# Patient Record
Sex: Male | Born: 1980 | Race: Black or African American | Hispanic: No | Smoking: Never smoker
Health system: Southern US, Community
[De-identification: ages and names within clinical notes are randomized; demographics above are authoritative.]

## PROBLEM LIST (undated history)

## (undated) DIAGNOSIS — M1990 Unspecified osteoarthritis, unspecified site: Secondary | ICD-10-CM

---

## 2002-03-07 ENCOUNTER — Emergency Department (HOSPITAL_COMMUNITY): Admission: EM | Admit: 2002-03-07 | Discharge: 2002-03-07 | Payer: Self-pay | Admitting: *Deleted

## 2002-03-07 ENCOUNTER — Encounter: Payer: Self-pay | Admitting: Emergency Medicine

## 2007-04-25 ENCOUNTER — Emergency Department: Payer: Self-pay | Admitting: Emergency Medicine

## 2013-03-03 ENCOUNTER — Observation Stay: Payer: Self-pay | Admitting: Surgery

## 2013-03-03 LAB — COMPREHENSIVE METABOLIC PANEL
Albumin: 3.9 g/dL (ref 3.4–5.0)
Alkaline Phosphatase: 76 U/L (ref 50–136)
Anion Gap: 11 (ref 7–16)
BUN: 11 mg/dL (ref 7–18)
Bilirubin,Total: 0.2 mg/dL (ref 0.2–1.0)
Calcium, Total: 8.6 mg/dL (ref 8.5–10.1)
Chloride: 109 mmol/L — ABNORMAL HIGH (ref 98–107)
Co2: 23 mmol/L (ref 21–32)
Creatinine: 1.45 mg/dL — ABNORMAL HIGH (ref 0.60–1.30)
EGFR (African American): >60
EGFR (Non-African Amer.): >60
Glucose: 115 mg/dL — ABNORMAL HIGH (ref 65–99)
Osmolality: 285 (ref 275–301)
Potassium: 3.4 mmol/L — ABNORMAL LOW (ref 3.5–5.1)
SGOT(AST): 49 U/L — ABNORMAL HIGH (ref 15–37)
SGPT (ALT): 85 U/L — ABNORMAL HIGH (ref 12–78)
Sodium: 143 mmol/L (ref 136–145)
Total Protein: 8.4 g/dL — ABNORMAL HIGH (ref 6.4–8.2)

## 2013-03-03 LAB — URINALYSIS, COMPLETE
Bacteria: NONE SEEN
Bilirubin,UR: NEGATIVE
Glucose,UR: NEGATIVE mg/dL (ref 0–75)
Ketone: NEGATIVE
Leukocyte Esterase: NEGATIVE
Nitrite: NEGATIVE
Ph: 5 (ref 4.5–8.0)
Protein: NEGATIVE
RBC,UR: 1 /HPF (ref 0–5)
Specific Gravity: 1.01 (ref 1.003–1.030)
Squamous Epithelial: NONE SEEN
WBC UR: 1 /HPF (ref 0–5)

## 2013-03-03 LAB — CBC WITH DIFFERENTIAL/PLATELET
Basophil #: 0.1 10*3/uL (ref 0.0–0.1)
Basophil %: 0.4 %
Eosinophil #: 0.1 10*3/uL (ref 0.0–0.7)
Eosinophil %: 0.8 %
HCT: 43.1 % (ref 40.0–52.0)
HGB: 14.6 g/dL (ref 13.0–18.0)
Lymphocyte #: 5.3 10*3/uL — ABNORMAL HIGH (ref 1.0–3.6)
Lymphocyte %: 43.5 %
MCH: 28.7 pg (ref 26.0–34.0)
MCHC: 33.8 g/dL (ref 32.0–36.0)
MCV: 85 fL (ref 80–100)
Monocyte #: 1.1 x10 3/mm — ABNORMAL HIGH (ref 0.2–1.0)
Monocyte %: 8.8 %
Neutrophil #: 5.7 10*3/uL (ref 1.4–6.5)
Neutrophil %: 46.5 %
Platelet: 292 10*3/uL (ref 150–440)
RBC: 5.07 10*6/uL (ref 4.40–5.90)
RDW: 13.8 % (ref 11.5–14.5)
WBC: 12.2 10*3/uL — ABNORMAL HIGH (ref 3.8–10.6)

## 2013-03-03 LAB — ETHANOL
Ethanol %: 0.174 % — ABNORMAL HIGH (ref 0.000–0.080)
Ethanol: 174 mg/dL

## 2013-03-03 LAB — DRUG SCREEN, URINE

## 2013-03-03 LAB — PROTIME-INR
INR: 1
Prothrombin Time: 13.2 secs (ref 11.5–14.7)

## 2013-03-03 LAB — APTT: Activated PTT: 30.4 secs (ref 23.6–35.9)

## 2013-09-26 ENCOUNTER — Emergency Department: Payer: Self-pay | Admitting: Emergency Medicine
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
## 2017-04-26 ENCOUNTER — Other Ambulatory Visit: Payer: Self-pay | Admitting: Internal Medicine

## 2017-04-26 DIAGNOSIS — R748 Abnormal levels of other serum enzymes: Secondary | ICD-10-CM

## 2017-05-03 ENCOUNTER — Ambulatory Visit
Admission: RE | Admit: 2017-05-03 | Discharge: 2017-05-03 | Disposition: A | Discharge status: Home / Self Care | Payer: 59 | Source: Ambulatory Visit | Attending: Internal Medicine | Admitting: Internal Medicine

## 2017-05-03 DIAGNOSIS — R932 Abnormal findings on diagnostic imaging of liver and biliary tract: Secondary | ICD-10-CM | POA: Insufficient documentation

## 2017-05-03 DIAGNOSIS — R748 Abnormal levels of other serum enzymes: Secondary | ICD-10-CM | POA: Diagnosis not present

## 2017-12-01 ENCOUNTER — Other Ambulatory Visit: Payer: Self-pay

## 2017-12-01 ENCOUNTER — Emergency Department
Admission: EM | Admit: 2017-12-01 | Discharge: 2017-12-01 | Disposition: A | Discharge status: Home / Self Care | Payer: 59 | Attending: Emergency Medicine | Admitting: Emergency Medicine

## 2017-12-01 DIAGNOSIS — S39012A Strain of muscle, fascia and tendon of lower back, initial encounter: Secondary | ICD-10-CM | POA: Diagnosis not present

## 2017-12-01 DIAGNOSIS — Y9389 Activity, other specified: Secondary | ICD-10-CM | POA: Diagnosis not present

## 2017-12-01 DIAGNOSIS — Y998 Other external cause status: Secondary | ICD-10-CM | POA: Diagnosis not present

## 2017-12-01 DIAGNOSIS — S3992XA Unspecified injury of lower back, initial encounter: Secondary | ICD-10-CM | POA: Diagnosis present

## 2017-12-01 DIAGNOSIS — Y92009 Unspecified place in unspecified non-institutional (private) residence as the place of occurrence of the external cause: Secondary | ICD-10-CM | POA: Diagnosis not present

## 2017-12-01 DIAGNOSIS — X509XXA Other and unspecified overexertion or strenuous movements or postures, initial encounter: Secondary | ICD-10-CM | POA: Diagnosis not present

## 2017-12-01 MED ORDER — LIDOCAINE 5 % EX PTCH
1.0000 | MEDICATED_PATCH | CUTANEOUS | Status: DC
  Administered 2017-12-01: 1 via TRANSDERMAL
  Filled 2017-12-01: qty 1

## 2017-12-01 MED ORDER — LIDOCAINE 5 % EX PTCH: 1.0000 | MEDICATED_PATCH | Freq: Two times a day (BID) | CUTANEOUS | 0 refills | Status: AC

## 2017-12-01 MED ORDER — CYCLOBENZAPRINE HCL 5 MG PO TABS: ORAL_TABLET | ORAL | 0 refills | Status: DC

## 2017-12-01 MED ORDER — CYCLOBENZAPRINE HCL 10 MG PO TABS
5.0000 mg | ORAL_TABLET | Freq: Once | ORAL | Status: AC
  Administered 2017-12-01: 5 mg via ORAL
  Filled 2017-12-01: qty 1

## 2017-12-01 MED ORDER — KETOROLAC TROMETHAMINE 30 MG/ML IJ SOLN: 30.0000 mg | Freq: Once | INTRAMUSCULAR | Status: DC

## 2017-12-01 MED ORDER — OXYCODONE-ACETAMINOPHEN 5-325 MG PO TABS
1.0000 | ORAL_TABLET | Freq: Once | ORAL | Status: AC
  Administered 2017-12-01: 1 via ORAL
  Filled 2017-12-01: qty 1

## 2017-12-01 MED ORDER — KETOROLAC TROMETHAMINE 30 MG/ML IJ SOLN
30.0000 mg | Freq: Once | INTRAMUSCULAR | Status: AC
  Administered 2017-12-01: 30 mg via INTRAMUSCULAR
  Filled 2017-12-01: qty 1

## 2017-12-01 NOTE — ED Triage Notes (Signed)
Pt to ER via POV c/o lower back pain since bending over today at home. Pt alert and oriented X4, active, cooperative, pt in NAD. RR even and unlabored, color WNL.

## 2017-12-01 NOTE — ED Notes (Signed)
Pt reports that he is having back pain that started this am - he bent over this morning and felt a burning pain that has not been relieved since this am
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
## 2019-09-03 ENCOUNTER — Other Ambulatory Visit: Payer: Self-pay

## 2019-09-03 DIAGNOSIS — Z20822 Contact with and (suspected) exposure to covid-19: Secondary | ICD-10-CM

## 2019-09-04 LAB — NOVEL CORONAVIRUS, NAA: SARS-CoV-2, NAA: NOT DETECTED

## 2021-12-08 ENCOUNTER — Emergency Department: Payer: 59

## 2021-12-08 ENCOUNTER — Encounter: Payer: Self-pay | Admitting: Emergency Medicine

## 2021-12-08 ENCOUNTER — Other Ambulatory Visit: Payer: Self-pay

## 2021-12-08 ENCOUNTER — Emergency Department
Admission: EM | Admit: 2021-12-08 | Discharge: 2021-12-08 | Disposition: A | Discharge status: Home / Self Care | Payer: 59 | Attending: Emergency Medicine | Admitting: Emergency Medicine

## 2021-12-08 DIAGNOSIS — S86811A Strain of other muscle(s) and tendon(s) at lower leg level, right leg, initial encounter: Secondary | ICD-10-CM | POA: Diagnosis not present

## 2021-12-08 DIAGNOSIS — M25561 Pain in right knee: Secondary | ICD-10-CM | POA: Diagnosis not present

## 2021-12-08 DIAGNOSIS — Y92009 Unspecified place in unspecified non-institutional (private) residence as the place of occurrence of the external cause: Secondary | ICD-10-CM | POA: Diagnosis not present

## 2021-12-08 DIAGNOSIS — S8991XA Unspecified injury of right lower leg, initial encounter: Secondary | ICD-10-CM | POA: Diagnosis present

## 2021-12-08 DIAGNOSIS — W19XXXA Unspecified fall, initial encounter: Secondary | ICD-10-CM | POA: Diagnosis not present

## 2021-12-08 MED ORDER — OXYCODONE-ACETAMINOPHEN 7.5-325 MG PO TABS: 1.0000 | ORAL_TABLET | Freq: Four times a day (QID) | ORAL | 0 refills | Status: DC | PRN

## 2021-12-08 MED ORDER — OXYCODONE-ACETAMINOPHEN 7.5-325 MG PO TABS
1.0000 | ORAL_TABLET | Freq: Once | ORAL | Status: AC
  Administered 2021-12-08: 1 via ORAL
  Filled 2021-12-08: qty 1

## 2021-12-08 NOTE — TOC Initial Note (Signed)
Transition of Care (TOC) - Initial/Assessment Note  ? ? ?Patient Details  ?Name: Todd [name] ?MRN: [id number] ?Date of Birth: 25-Jul-1981 ? ?Transition of Care (TOC) CM/SW Contact:    ?Shelbie Hutching, RN ?Phone Number: ?12/08/2021, 2:38 PM ? ?Clinical Narrative:                 ? ?Patient came into the emergency room after falling at home and injuring his right knee.  Patient needs a bariatric walker to go home with to safely ambulate.  Walker ordered and referral accepted by [facility].  Danielle with [employer] will deliver to the patient's room- should be here before 3pm.   ?  ?Expected Discharge Plan: Home/Self Care ?Barriers to Discharge: Barriers Resolved ? ? ?Patient Goals and CMS Choice ?Patient states their goals for this hospitalization and ongoing recovery are:: Getting walker and returning home with wife ?  ?  ? ?Expected Discharge Plan and Services ?Expected Discharge Plan: Home/Self Care ?  ?  ?  ?Living arrangements for the past 2 months: [place] ?                ?DME Arranged: Walker rolling (Bariatric) ?DME Agency: [facility] ?Date DME Agency Contacted: 12/08/21 ?Time DME Agency Contacted: [id number] ?Representative spoke with at DME Agency: Stephannie Peters ?HH Arranged: NA ?[place] Agency: NA ?  ?  ?  ? ?Prior Living Arrangements/Services ?Living arrangements for the past 2 months: [place] ?Lives with:: Spouse ?Patient language and need for interpreter reviewed:: Yes ?Do you feel safe going back to the place where you live?: Yes      ?Need for Family Participation in Patient Care: Yes (Comment) ?Care giver support system in place?: Yes (comment) (wife) ?  ?Criminal Activity/Legal Involvement Pertinent to Current Situation/Hospitalization: No - Comment as needed ? ?Activities of Daily Living ?  ?  ? ?Permission Sought/Granted ?  ?  ?   ?   ?   ?   ? ?Emotional Assessment ?  ?  ?  ?Orientation: : Oriented to Self, Oriented to Place, Oriented to  Time, Oriented to Situation ?Alcohol / Substance Use:  Not Applicable ?Psych Involvement: No (comment) ? ?Admission diagnosis:  r knee pain ?There are no problems to display for this patient. ? ?PCP:  Lavera Guise, MD ?Pharmacy:   ?[facility] [address] ?[place] ?[place] [address] ?Phone: [phone] Fax: [phone] ? ? ? ? ?Social Determinants of Health (SDOH) Interventions ?  ? ?Readmission Risk Interventions ?No flowsheet data found. ? ? ?

## 2021-12-08 NOTE — ED Provider Notes (Signed)
? ?[facility] ?Provider Note ? ? ? Event Date/Time  ? First MD Initiated Contact with Patient 12/08/21 [phone]   ?  (approximate) ? ? ?History  ? ?Knee Pain and Fall ? ? ?HPI ? ?Todd [name] is a 41 y.o. male presents to the ED via EMS from home with complaint of mechanical fall and injury to his right knee.  Patient states that he slipped on a step in his home and his knee went back behind him.  Patient denies any head injury or loss of consciousness during this event.  Patient states he was initially able to ambulate with assistance but is having difficulty bearing weight on his right leg.  Patient rates his pain as 6 out of 10. ? ? ?Physical Exam  ? ?Triage Vital Signs: ?ED Triage Vitals  ?Enc Vitals Group  ?   BP 12/08/21 0809 (!) 192/100  ?   Pulse Rate 12/08/21 0809 72  ?   Resp 12/08/21 0809 18  ?   Temp 12/08/21 0809 (!) 97.4 ?F (36.3 ?C)  ?   Temp Source 12/08/21 0809 Oral  ?   SpO2 12/08/21 0809 96 %  ?   Weight 12/08/21 0801 (!) 360 lb 0.2 oz (163.3 kg)  ?   Height 12/08/21 0801 6\' 5"  (1.956 m)  ?   Head Circumference --   ?   Peak Flow --   ?   Pain Score 12/08/21 0800 6  ?   Pain Loc --   ?   Pain Edu? --   ?   Excl. in GC? --   ? ? ?Most recent vital signs: ?Vitals:  ? 12/08/21 0830 12/08/21 1153  ?BP: (!) 150/86 (!) 158/89  ?Pulse: 70 81  ?Resp: 20 16  ?Temp:    ?SpO2: 97% 97%  ? ? ? ?General: Awake, no distress.  ?CV:  Good peripheral perfusion.  Heart regular rate and rhythm. ?Resp:  Normal effort.  Lungs are clear bilaterally. ?Abd:  No distention.  Soft, nontender, bowel sounds normoactive x4 quadrants. ?Other:  Examination of the right lower extremity there is moderate soft tissue edema noted anterior knee area without open wound.  Marked tenderness to palpation.  Range of motion is deferred due to pain.  Pulses present distally.  Motor sensory function intact distally.  No ecchymosis or abrasions are seen at this time. ? ? ?ED Results / Procedures / Treatments   ? ?Labs ?(all labs ordered are listed, but only abnormal results are displayed) ?Labs Reviewed - No data to display ? ? ?RADIOLOGY ? ?X-ray right knee no fractures noted but small effusion present.  Radiology report mentions high patella orientation that is suspicious for a patella tendon injury.  MRI was then ordered and there is a full-thickness patella tendon tear. ? ? ?PROCEDURES: ? ?Critical Care performed:  ? ?Procedures ? ? ?MEDICATIONS ORDERED IN ED: ?Medications  ?oxyCODONE-acetaminophen (PERCOCET) 7.5-325 MG per tablet 1 tablet (1 tablet Oral Given 12/08/21 0836)  ?oxyCODONE-acetaminophen (PERCOCET) 7.5-325 MG per tablet 1 tablet (1 tablet Oral Given 12/08/21 1328)  ? ? ? ?IMPRESSION / MDM / ASSESSMENT AND PLAN / ED COURSE  ?I reviewed the triage vital signs and the nursing notes. ? ? ?Differential diagnosis includes, but is not limited to, fracture patella, fracture tibial plateau, right knee sprain, right knee contusion. ? ?41 year old male presents to the ED via EMS after a fall at home when he slipped off the step and landed with his knee behind his  body.  Patient denied any head injury or loss of consciousness during this event.  He states that he was unable to bear weight on his right leg.  At the time of initial exam there was moderate soft tissue edema present.  X-rays were suspicious for a patella tendon injury due to the location of his patella but no fractures were noted.  MRI confirmed that there was a full-thickness mid patella tendon injury.  Dr. Joice Lofts who is on-call for orthopedics was consulted and patient will be followed up in the office.  Instructions for a knee immobilizer, crutches/walker and pain medication.  It was determined that patient was over the weight limit for crutches that were in the emergency department.  Also the walker that was available to [place] was not able to be adjusted to his height.  A bariatric walker was obtained and patient was made aware that he needs to use this,  ice and elevation and wear knee immobilizer until he is seen by Dr. Joice Lofts.  Percocet 7.5 was sent to the pharmacy.  Also a note was written for patient to be out of work to the end of the month at which time Dr. Joice Lofts will be able to extend that work note to include any procedures that may need to be done. ? ? ? ? ? ?FINAL CLINICAL IMPRESSION(S) / ED DIAGNOSES  ? ?Final diagnoses:  ?Rupture of right patellar tendon, initial encounter  ?Fall in home, initial encounter  ? ? ? ?Rx / DC Orders  ? ?ED Discharge Orders   ? ? None  ? ?  ? ? ? ?Note:  This document was prepared using Dragon voice recognition software and may include unintentional dictation errors. ?  ?Tommi Rumps, PA-C ?12/08/21 1507 ? ?  ?Sharyn Creamer, MD ?12/08/21 1704 ? ?

## 2021-12-08 NOTE — ED Notes (Signed)
Pt tolerated walking well with bariatric walker.  ?

## 2021-12-08 NOTE — ED Notes (Signed)
Pt in MRI.

## 2021-12-08 NOTE — ED Triage Notes (Signed)
Pt comes into the ED via [facility] from home c/o mechanical fall and c/o right knee pain.  Pt has noticeable swelling noted to the knee.  Pt in NAD at this time with even and unlabored respirations.  Pt able to ambulate with assistance on the left leg, but was unable to bear weight on the right leg.   ?

## 2021-12-08 NOTE — Discharge Instructions (Addendum)
Call Dr. Binnie Rail office for an appointment time.  Ice and elevation to your right knee. ?Wear knee immobilizer until seen by Dr. Joice Lofts. ?Do not bear weight.  Use walker when up.  Pain medication was sent to your pharmacy.  Be aware that this medication could cause drowsiness and increase your risk for falling. ?

## 2021-12-08 NOTE — ED Notes (Addendum)
See triage note. Pt here with injury to his left knee. Knee is swollen and pt cannot bear weight on it. ?

## 2021-12-08 NOTE — ED Notes (Signed)
Patient transported to MRI 

## 2021-12-10 ENCOUNTER — Other Ambulatory Visit: Payer: Self-pay | Admitting: Orthopedic Surgery

## 2021-12-15 ENCOUNTER — Other Ambulatory Visit: Payer: Self-pay

## 2021-12-15 ENCOUNTER — Other Ambulatory Visit
Admission: RE | Admit: 2021-12-15 | Discharge: 2021-12-15 | Disposition: A | Discharge status: Home / Self Care | Payer: 59 | Source: Ambulatory Visit | Attending: Orthopedic Surgery | Admitting: Orthopedic Surgery

## 2021-12-15 HISTORY — DX: Unspecified osteoarthritis, unspecified site: M19.90

## 2021-12-15 NOTE — Patient Instructions (Addendum)
Your procedure is scheduled on: 12/16/21  ?Report to the Registration Desk on the 1st floor of the [facility]. ?To find out your arrival time, please call [phone] between 1PM - 3PM on: 12/15/21 ? ?REMEMBER: ?Instructions that are not followed completely may result in serious medical risk, up to and including death; or upon the discretion of your surgeon and anesthesiologist your surgery may need to be rescheduled. ? ?Do not eat food after midnight the night before surgery.  ?No gum chewing, lozengers or hard candies. ? ?You may however, drink CLEAR liquids up to 2 hours before you are scheduled to arrive for your surgery. Do not drink anything within 2 hours of your scheduled arrival time. ? ?Clear liquids include: ?- water  ?- apple juice without pulp ?- gatorade (not RED colors) ?- black coffee or tea (Do NOT add milk or creamers to the coffee or tea) ?Do NOT drink anything that is not on this list. ? ?TAKE THESE MEDICATIONS THE MORNING OF SURGERY WITH A SIP OF WATER:  ?- oxyCODONE-acetaminophen (PERCOCET) 7.5-325 MG tablet if needed. ? ?One week prior to surgery: ?Stop Anti-inflammatories (NSAIDS) such as Advil, Aleve, Ibuprofen, Motrin, Naproxen, Naprosyn and Aspirin based products such as Excedrin, Goodys Powder, BC Powder. ? ?Stop ANY OVER THE COUNTER supplements until after surgery. ? ?You may however, continue to take Tylenol if needed for pain up until the day of surgery. ? ?No Alcohol for 24 hours before or after surgery. ? ?No Smoking including e-cigarettes for 24 hours prior to surgery.  ?No chewable tobacco products for at least 6 hours prior to surgery.  ?No nicotine patches on the day of surgery. ? ?Do not use any "recreational" drugs for at least a week prior to your surgery.  ?Please be advised that the combination of cocaine and anesthesia may have negative outcomes, up to and including death. ?If you test positive for cocaine, your surgery will be cancelled. ? ?On the morning of surgery  brush your teeth with toothpaste and water, you may rinse your mouth with mouthwash if you wish. ?Do not swallow any toothpaste or mouthwash. ? ?Do not wear jewelry, make-up, hairpins, clips or nail polish. ? ?Do not wear lotions, powders, or perfumes.  ? ?Do not shave body from the neck down 48 hours prior to surgery just in case you cut yourself which could leave a site for infection.  ?Also, freshly shaved skin may become irritated if using the CHG soap. ? ?Contact lenses, hearing aids and dentures may not be worn into surgery. ? ?Do not bring valuables to the hospital. [facility] is not responsible for any missing/lost belongings or valuables.  ? ?Notify your doctor if there is any change in your medical condition (cold, fever, infection). ? ?Wear comfortable clothing (specific to your surgery type) to the hospital. ? ?After surgery, you can help prevent lung complications by doing breathing exercises.  ?Take deep breaths and cough every 1-2 hours. Your doctor may order a device called an Incentive Spirometer to help you take deep breaths. ?When coughing or sneezing, hold a pillow firmly against your incision with both hands. This is called ?splinting.? Doing this helps protect your incision. It also decreases belly discomfort. ? ?If you are being admitted to the hospital overnight, leave your suitcase in the car. ?After surgery it may be brought to your room. ? ?If you are being discharged the day of surgery, you will not be allowed to drive home. ?You will need a responsible  adult (18 years or older) to drive you home and stay with you that night.  ? ?If you are taking public transportation, you will need to have a responsible adult (18 years or older) with you. ?Please confirm with your physician that it is acceptable to use public transportation.  ? ?Please call the Pre-admissions Testing Dept. at [phone] if you have any questions about these instructions. ? ?Surgery Visitation Policy: ? ?Patients  undergoing a surgery or procedure may have two family members or support persons with them as long as the person is not COVID-19 positive or experiencing its symptoms.  ? ?Inpatient Visitation:   ? ?Visiting hours are 7 a.m. to 8 p.m. ?Up to four visitors are allowed at one time in a patient room, including children. The visitors may rotate out with other people during the day. One designated support person (adult) may remain overnight. ? ?All Areas: ?All visitors must pass COVID-19 screenings, use hand sanitizer when entering and exiting the patient?s room and wear a mask at all times, including in the patient?s room. ?Patients must also wear a mask when staff or their visitor are in the room. ?Masking is required regardless of vaccination status.  ?

## 2021-12-16 ENCOUNTER — Ambulatory Visit: Payer: 59 | Admitting: Urgent Care

## 2021-12-16 ENCOUNTER — Ambulatory Visit
Admission: RE | Admit: 2021-12-16 | Discharge: 2021-12-16 | Disposition: A | Discharge status: Home / Self Care | Payer: 59 | Attending: Orthopedic Surgery | Admitting: Orthopedic Surgery

## 2021-12-16 ENCOUNTER — Ambulatory Visit: Payer: 59

## 2021-12-16 ENCOUNTER — Other Ambulatory Visit: Payer: Self-pay

## 2021-12-16 ENCOUNTER — Encounter
Admission: RE | Disposition: A | Discharge status: Home / Self Care | Payer: Self-pay | Source: Home / Self Care | Attending: Orthopedic Surgery

## 2021-12-16 ENCOUNTER — Encounter: Payer: Self-pay | Admitting: Orthopedic Surgery

## 2021-12-16 DIAGNOSIS — X58XXXA Exposure to other specified factors, initial encounter: Secondary | ICD-10-CM | POA: Insufficient documentation

## 2021-12-16 DIAGNOSIS — W010XXA Fall on same level from slipping, tripping and stumbling without subsequent striking against object, initial encounter: Secondary | ICD-10-CM | POA: Insufficient documentation

## 2021-12-16 DIAGNOSIS — S76111A Strain of right quadriceps muscle, fascia and tendon, initial encounter: Secondary | ICD-10-CM | POA: Diagnosis not present

## 2021-12-16 HISTORY — PX: PATELLAR TENDON REPAIR: SHX737

## 2021-12-16 SURGERY — REPAIR, TENDON, PATELLAR
Anesthesia: General | Site: Knee | Laterality: Right

## 2021-12-16 MED ORDER — GLYCOPYRROLATE 0.2 MG/ML IJ SOLN
INTRAMUSCULAR | Status: DC | PRN
Start: 2021-12-16 — End: 2021-12-16
  Administered 2021-12-16: .2 mg via INTRAVENOUS

## 2021-12-16 MED ORDER — ONDANSETRON HCL 4 MG/2ML IJ SOLN: 4.0000 mg | Freq: Once | INTRAMUSCULAR | Status: DC | PRN

## 2021-12-16 MED ORDER — CHLORHEXIDINE GLUCONATE 0.12 % MT SOLN
OROMUCOSAL | Status: AC
  Administered 2021-12-16: 15 mL via OROMUCOSAL
  Filled 2021-12-16: qty 15

## 2021-12-16 MED ORDER — PROPOFOL 10 MG/ML IV BOLUS
INTRAVENOUS | Status: DC | PRN
  Administered 2021-12-16: 200 mg via INTRAVENOUS

## 2021-12-16 MED ORDER — SUGAMMADEX SODIUM 500 MG/5ML IV SOLN
INTRAVENOUS | Status: DC | PRN
  Administered 2021-12-16: 400 mg via INTRAVENOUS

## 2021-12-16 MED ORDER — CEFAZOLIN IN SODIUM CHLORIDE 3-0.9 GM/100ML-% IV SOLN
3.0000 g | INTRAVENOUS | Status: AC
  Administered 2021-12-16: 3 g via INTRAVENOUS
  Filled 2021-12-16: qty 100

## 2021-12-16 MED ORDER — SUGAMMADEX SODIUM 500 MG/5ML IV SOLN
INTRAVENOUS | Status: AC
  Filled 2021-12-16: qty 5

## 2021-12-16 MED ORDER — LABETALOL HCL 5 MG/ML IV SOLN
INTRAVENOUS | Status: DC | PRN
  Administered 2021-12-16 (×2): 10 mg via INTRAVENOUS

## 2021-12-16 MED ORDER — PROPOFOL 10 MG/ML IV BOLUS
INTRAVENOUS | Status: AC
  Filled 2021-12-16: qty 20

## 2021-12-16 MED ORDER — BUPIVACAINE-EPINEPHRINE (PF) 0.5% -1:200000 IJ SOLN
INTRAMUSCULAR | Status: AC
Start: 2021-12-16 — End: ?
  Filled 2021-12-16: qty 30

## 2021-12-16 MED ORDER — NEOMYCIN-POLYMYXIN B GU 40-200000 IR SOLN
Status: DC | PRN
  Administered 2021-12-16: 4 mL

## 2021-12-16 MED ORDER — ROPIVACAINE HCL 5 MG/ML IJ SOLN
INTRAMUSCULAR | Status: DC | PRN
  Administered 2021-12-16: 20 mL via PERINEURAL

## 2021-12-16 MED ORDER — CHLORHEXIDINE GLUCONATE 0.12 % MT SOLN: 15.0000 mL | Freq: Once | OROMUCOSAL | Status: AC

## 2021-12-16 MED ORDER — OXYCODONE HCL 5 MG PO TABS
5.0000 mg | ORAL_TABLET | Freq: Once | ORAL | Status: AC | PRN
  Administered 2021-12-16: 5 mg via ORAL

## 2021-12-16 MED ORDER — ROPIVACAINE HCL 5 MG/ML IJ SOLN
INTRAMUSCULAR | Status: AC
  Filled 2021-12-16: qty 30

## 2021-12-16 MED ORDER — ASPIRIN EC 325 MG PO TBEC: 325.0000 mg | DELAYED_RELEASE_TABLET | Freq: Every day | ORAL | 0 refills | Status: AC

## 2021-12-16 MED ORDER — SODIUM CHLORIDE 0.9 % IR SOLN
Status: DC | PRN
  Administered 2021-12-16: 1000 mL

## 2021-12-16 MED ORDER — FENTANYL CITRATE (PF) 100 MCG/2ML IJ SOLN
INTRAMUSCULAR | Status: AC
Start: 2021-12-16 — End: ?
  Filled 2021-12-16: qty 2

## 2021-12-16 MED ORDER — DEXMEDETOMIDINE (PRECEDEX) IN NS 20 MCG/5ML (4 MCG/ML) IV SYRINGE
PREFILLED_SYRINGE | INTRAVENOUS | Status: DC | PRN
  Administered 2021-12-16 (×2): 8 ug via INTRAVENOUS
  Administered 2021-12-16: 12 ug via INTRAVENOUS
  Administered 2021-12-16 (×3): 8 ug via INTRAVENOUS

## 2021-12-16 MED ORDER — ORAL CARE MOUTH RINSE: 15.0000 mL | Freq: Once | OROMUCOSAL | Status: AC

## 2021-12-16 MED ORDER — ONDANSETRON HCL 4 MG/2ML IJ SOLN
INTRAMUSCULAR | Status: DC | PRN
  Administered 2021-12-16: 4 mg via INTRAVENOUS

## 2021-12-16 MED ORDER — FENTANYL CITRATE (PF) 100 MCG/2ML IJ SOLN
INTRAMUSCULAR | Status: AC
  Administered 2021-12-16: 25 ug via INTRAVENOUS
  Filled 2021-12-16: qty 2

## 2021-12-16 MED ORDER — DEXAMETHASONE SODIUM PHOSPHATE 10 MG/ML IJ SOLN
INTRAMUSCULAR | Status: DC | PRN
  Administered 2021-12-16: 5 mg via INTRAVENOUS

## 2021-12-16 MED ORDER — ACETAMINOPHEN 10 MG/ML IV SOLN: 1000.0000 mg | Freq: Once | INTRAVENOUS | Status: DC | PRN

## 2021-12-16 MED ORDER — MIDAZOLAM HCL 2 MG/2ML IJ SOLN: 4.0000 mg | Freq: Once | INTRAMUSCULAR | Status: AC

## 2021-12-16 MED ORDER — PHENYLEPHRINE HCL (PRESSORS) 10 MG/ML IV SOLN
INTRAVENOUS | Status: DC | PRN
  Administered 2021-12-16: 160 ug via INTRAVENOUS

## 2021-12-16 MED ORDER — ONDANSETRON HCL 4 MG/2ML IJ SOLN
INTRAMUSCULAR | Status: AC
  Filled 2021-12-16: qty 2

## 2021-12-16 MED ORDER — DEXMEDETOMIDINE HCL IN NACL 200 MCG/50ML IV SOLN
INTRAVENOUS | Status: AC
  Filled 2021-12-16: qty 50

## 2021-12-16 MED ORDER — LABETALOL HCL 5 MG/ML IV SOLN
INTRAVENOUS | Status: AC
  Filled 2021-12-16: qty 4

## 2021-12-16 MED ORDER — DEXAMETHASONE SODIUM PHOSPHATE 4 MG/ML IJ SOLN
INTRAMUSCULAR | Status: DC | PRN
  Administered 2021-12-16: 4 mg via PERINEURAL

## 2021-12-16 MED ORDER — OXYCODONE HCL 5 MG/5ML PO SOLN: 5.0000 mg | Freq: Once | ORAL | Status: AC | PRN

## 2021-12-16 MED ORDER — ACETAMINOPHEN 10 MG/ML IV SOLN
INTRAVENOUS | Status: AC
  Filled 2021-12-16: qty 100

## 2021-12-16 MED ORDER — BUPIVACAINE LIPOSOME 1.3 % IJ SUSP
INTRAMUSCULAR | Status: DC | PRN
  Administered 2021-12-16: 20 mL

## 2021-12-16 MED ORDER — ROCURONIUM BROMIDE 100 MG/10ML IV SOLN
INTRAVENOUS | Status: DC | PRN
  Administered 2021-12-16: 40 mg via INTRAVENOUS
  Administered 2021-12-16 (×2): 20 mg via INTRAVENOUS

## 2021-12-16 MED ORDER — LIDOCAINE-EPINEPHRINE 1 %-1:100000 IJ SOLN
INTRAMUSCULAR | Status: AC
  Filled 2021-12-16: qty 1

## 2021-12-16 MED ORDER — DEXAMETHASONE SODIUM PHOSPHATE 10 MG/ML IJ SOLN
INTRAMUSCULAR | Status: AC
  Filled 2021-12-16: qty 1

## 2021-12-16 MED ORDER — ONDANSETRON 4 MG PO TBDP: 4.0000 mg | ORAL_TABLET | Freq: Three times a day (TID) | ORAL | 0 refills | Status: DC | PRN

## 2021-12-16 MED ORDER — OXYCODONE HCL 5 MG PO TABS: 5.0000 mg | ORAL_TABLET | ORAL | 0 refills | Status: AC | PRN

## 2021-12-16 MED ORDER — FENTANYL CITRATE (PF) 100 MCG/2ML IJ SOLN
INTRAMUSCULAR | Status: DC | PRN
  Administered 2021-12-16 (×4): 50 ug via INTRAVENOUS

## 2021-12-16 MED ORDER — FENTANYL CITRATE (PF) 100 MCG/2ML IJ SOLN
INTRAMUSCULAR | Status: AC
  Filled 2021-12-16: qty 2

## 2021-12-16 MED ORDER — NEOMYCIN-POLYMYXIN B GU 40-200000 IR SOLN
Status: AC
Start: 2021-12-16 — End: ?
  Filled 2021-12-16: qty 2

## 2021-12-16 MED ORDER — LACTATED RINGERS IV SOLN: INTRAVENOUS | Status: DC

## 2021-12-16 MED ORDER — OXYCODONE HCL 5 MG PO TABS
ORAL_TABLET | ORAL | Status: AC
  Filled 2021-12-16: qty 1

## 2021-12-16 MED ORDER — ACETAMINOPHEN 10 MG/ML IV SOLN
INTRAVENOUS | Status: DC | PRN
  Administered 2021-12-16: 1000 mg via INTRAVENOUS

## 2021-12-16 MED ORDER — MIDAZOLAM HCL 2 MG/2ML IJ SOLN
INTRAMUSCULAR | Status: AC
  Administered 2021-12-16: 4 mg via INTRAVENOUS
  Filled 2021-12-16: qty 4

## 2021-12-16 MED ORDER — BUPIVACAINE HCL (PF) 0.5 % IJ SOLN
INTRAMUSCULAR | Status: AC
  Filled 2021-12-16: qty 30

## 2021-12-16 MED ORDER — SEVOFLURANE IN SOLN
RESPIRATORY_TRACT | Status: AC
  Filled 2021-12-16: qty 250

## 2021-12-16 MED ORDER — LIDOCAINE HCL (PF) 2 % IJ SOLN
INTRAMUSCULAR | Status: AC
  Filled 2021-12-16: qty 5

## 2021-12-16 MED ORDER — LIDOCAINE HCL (CARDIAC) PF 100 MG/5ML IV SOSY
PREFILLED_SYRINGE | INTRAVENOUS | Status: DC | PRN
  Administered 2021-12-16: 100 mg via INTRAVENOUS

## 2021-12-16 MED ORDER — LIDOCAINE-EPINEPHRINE 1 %-1:100000 IJ SOLN
INTRAMUSCULAR | Status: AC
Start: 2021-12-16 — End: ?
  Filled 2021-12-16: qty 1

## 2021-12-16 MED ORDER — BUPIVACAINE LIPOSOME 1.3 % IJ SUSP
INTRAMUSCULAR | Status: AC
  Filled 2021-12-16: qty 20

## 2021-12-16 MED ORDER — ACETAMINOPHEN 500 MG PO TABS: 1000.0000 mg | ORAL_TABLET | Freq: Three times a day (TID) | ORAL | 2 refills | Status: AC

## 2021-12-16 MED ORDER — SUCCINYLCHOLINE CHLORIDE 200 MG/10ML IV SOSY
PREFILLED_SYRINGE | INTRAVENOUS | Status: DC | PRN
  Administered 2021-12-16: 140 mg via INTRAVENOUS

## 2021-12-16 MED ORDER — LIDOCAINE HCL (PF) 1 % IJ SOLN
INTRAMUSCULAR | Status: AC
  Filled 2021-12-16: qty 30

## 2021-12-16 MED ORDER — FENTANYL CITRATE (PF) 100 MCG/2ML IJ SOLN
25.0000 ug | INTRAMUSCULAR | Status: DC | PRN
  Administered 2021-12-16: 50 ug via INTRAVENOUS
  Administered 2021-12-16: 25 ug via INTRAVENOUS

## 2021-12-16 MED ORDER — SODIUM CHLORIDE (PF) 0.9 % IJ SOLN
INTRAMUSCULAR | Status: AC
  Filled 2021-12-16: qty 10

## 2021-12-16 MED ORDER — SUCCINYLCHOLINE CHLORIDE 200 MG/10ML IV SOSY
PREFILLED_SYRINGE | INTRAVENOUS | Status: AC
  Filled 2021-12-16: qty 10

## 2021-12-16 SURGICAL SUPPLY — 94 items
"PENCIL ELECTRO HAND CTR " (MISCELLANEOUS) ×1 IMPLANT
ADAPTER IRRIG TUBE 2 SPIKE SOL (ADAPTER) ×4 IMPLANT
ADPR TBG 2 SPK PMP STRL ASCP (ADAPTER)
ANCH SUT 1.4 SUT TPE BLK/WHT (SUTURE) ×2
ANCH SUT SWLK 19.1X4.75 (Anchor) ×2 IMPLANT
ANCHOR SUT BIO SW 4.75X19.1 (Anchor) ×2 IMPLANT
BANDAGE ACE 4X5 VEL STRL LF (GAUZE/BANDAGES/DRESSINGS) ×3 IMPLANT
BANDAGE ACE 6X5 VEL STRL LF (GAUZE/BANDAGES/DRESSINGS) ×6 IMPLANT
BASIN GRAD PLASTIC 32OZ STRL (MISCELLANEOUS) ×2 IMPLANT
BLADE SURG 10 STRL SS (BLADE) ×2 IMPLANT
BLADE SURG 15 STRL LF DISP TIS (BLADE) IMPLANT
BLADE SURG 15 STRL SS (BLADE) ×4
BLADE SURG SZ10 CARB STEEL (BLADE) ×6 IMPLANT
BLADE SURG SZ11 CARB STEEL (BLADE) ×2 IMPLANT
BNDG ESMARK 6X12 TAN STRL LF (GAUZE/BANDAGES/DRESSINGS) ×3 IMPLANT
BRACE KNEE POST OP SHORT (BRACE) ×2 IMPLANT
BUR 4X45 EGG (BURR) ×2 IMPLANT
BUR 4X55 1 (BURR) ×2 IMPLANT
BUR RADIUS 3.5 (BURR) ×2 IMPLANT
BUR RADIUS 4.0X18.5 (BURR) ×2 IMPLANT
CAST PADDING 6X4YD ST 30248 (SOFTGOODS) ×1
CHLORAPREP W/TINT 26ML (MISCELLANEOUS) ×6 IMPLANT
COOLER POLAR GLACIER W/PUMP (MISCELLANEOUS) ×3 IMPLANT
COVER MAYO STAND STRL (DRAPES) ×3 IMPLANT
DRAPE C-ARM XRAY 36X54 (DRAPES) ×2 IMPLANT
DRAPE C-ARMOR (DRAPES) ×2 IMPLANT
DRAPE IMP U-DRAPE 54X76 (DRAPES) ×3 IMPLANT
DRAPE SHEET LG 3/4 BI-LAMINATE (DRAPES) ×2 IMPLANT
DRAPE SURG 17X11 SM STRL (DRAPES) ×6 IMPLANT
DRAPE TABLE BACK 80X90 (DRAPES) ×3 IMPLANT
DRSG OPSITE POSTOP 3X4 (GAUZE/BANDAGES/DRESSINGS) ×2 IMPLANT
DRSG OPSITE POSTOP 4X8 (GAUZE/BANDAGES/DRESSINGS) ×1 IMPLANT
ELECT REM PT RETURN 9FT ADLT (ELECTROSURGICAL) ×2
ELECTRODE REM PT RTRN 9FT ADLT (ELECTROSURGICAL) ×2 IMPLANT
GAUZE SPONGE 4X4 12PLY STRL (GAUZE/BANDAGES/DRESSINGS) ×2 IMPLANT
GAUZE XEROFORM 1X8 LF (GAUZE/BANDAGES/DRESSINGS) ×1 IMPLANT
GLOVE BIOGEL PI IND STRL 8 (GLOVE) ×4 IMPLANT
GLOVE BIOGEL PI INDICATOR 8 (GLOVE) ×1
GLOVE SURG ORTHO 8.0 STRL STRW (GLOVE) ×9 IMPLANT
GOWN STRL REUS W/ TWL LRG LVL3 (GOWN DISPOSABLE) ×4 IMPLANT
GOWN STRL REUS W/ TWL XL LVL3 (GOWN DISPOSABLE) ×2 IMPLANT
GOWN STRL REUS W/TWL LRG LVL3 (GOWN DISPOSABLE) ×4
GOWN STRL REUS W/TWL XL LVL3 (GOWN DISPOSABLE) ×2
GRADUATE 1200CC STRL 31836 (MISCELLANEOUS) ×2 IMPLANT
HANDLE YANKAUER SUCT BULB TIP (MISCELLANEOUS) ×3 IMPLANT
KIT TURNOVER KIT A (KITS) ×3 IMPLANT
LABEL OR SOLS (LABEL) ×3 IMPLANT
MANIFOLD NEPTUNE II (INSTRUMENTS) ×3 IMPLANT
MAT BLUE FLOOR 46X72 FLO (MISCELLANEOUS) ×2 IMPLANT
NDL FILTER BLUNT 18X1 1/2 (NEEDLE) ×2 IMPLANT
NDL MAYO 6 CRC TAPER PT (NEEDLE) IMPLANT
NEEDLE FILTER BLUNT 18X 1/2SAF (NEEDLE) ×1
NEEDLE FILTER BLUNT 18X1 1/2 (NEEDLE) ×1 IMPLANT
NEEDLE HYPO 22GX1.5 SAFETY (NEEDLE) ×2 IMPLANT
NEEDLE MAYO 6 CRC TAPER PT (NEEDLE) ×2 IMPLANT
NS IRRIG 1000ML POUR BTL (IV SOLUTION) ×3 IMPLANT
PACK ARTHROSCOPY KNEE (MISCELLANEOUS) ×3 IMPLANT
PAD ABD DERMACEA PRESS 5X9 (GAUZE/BANDAGES/DRESSINGS) ×2 IMPLANT
PAD CAST CTTN 4X4 STRL (SOFTGOODS) ×2 IMPLANT
PAD WRAPON POLAR KNEE (MISCELLANEOUS) ×2 IMPLANT
PADDING CAST 6X4YD NS (MISCELLANEOUS) ×1
PADDING CAST COTTON 4X4 STRL (SOFTGOODS) ×2
PADDING CAST COTTON 6X4 NS (MISCELLANEOUS) ×2 IMPLANT
PADDING CAST COTTON 6X4 ST (SOFTGOODS) IMPLANT
PENCIL ELECTRO HAND CTR (MISCELLANEOUS) ×3 IMPLANT
RETRIEVER SUT HEWSON (MISCELLANEOUS) ×2 IMPLANT
SET TUBE SUCT SHAVER OUTFL 24K (TUBING) ×2 IMPLANT
SET TUBE TIP INTRA-ARTICULAR (MISCELLANEOUS) ×2 IMPLANT
SPONGE LAP 18X18 5 PK (GAUZE/BANDAGES/DRESSINGS) ×3 IMPLANT
STAPLER SKIN PROX 35W (STAPLE) ×3 IMPLANT
STOCKINETTE BIAS CUT 6 980064 (GAUZE/BANDAGES/DRESSINGS) ×1 IMPLANT
SUCTION FRAZIER HANDLE 10FR (MISCELLANEOUS)
SUCTION TUBE FRAZIER 10FR DISP (MISCELLANEOUS) ×2 IMPLANT
SUT ETHILON 3-0 FS-10 30 BLK (SUTURE) ×2
SUT VIC AB 0 CT1 36 (SUTURE) ×3 IMPLANT
SUT VIC AB 1 CT1 36 (SUTURE) ×2 IMPLANT
SUT VIC AB 2-0 CT1 27 (SUTURE) ×4
SUT VIC AB 2-0 CT1 TAPERPNT 27 (SUTURE) ×4 IMPLANT
SUT VIC AB 2-0 CT2 27 (SUTURE) ×3 IMPLANT
SUT XBRAID 1.4 BLK/WHT (SUTURE) ×2 IMPLANT
SUT XBRAID 1.4 WHITE/BLUE (SUTURE) ×2 IMPLANT
SUTURE EHLN 3-0 FS-10 30 BLK (SUTURE) ×2 IMPLANT
SUTURE TAPE FIBERLINK 1.3 LOOP (SUTURE) IMPLANT
SUTURETAPE FIBERLINK 1.3 LOOP (SUTURE) ×2
SYR 10ML LL (SYRINGE) ×3 IMPLANT
SYR 30ML LL (SYRINGE) ×3 IMPLANT
SYR BULB IRRIG 60ML STRL (SYRINGE) ×3 IMPLANT
SYS INTERNAL BRACE KNEE (Miscellaneous) ×2 IMPLANT
SYSTEM INTERNAL BRACE KNEE (Miscellaneous) IMPLANT
TOWEL OR 17X26 4PK STRL BLUE (TOWEL DISPOSABLE) ×6 IMPLANT
TUBING ARTHRO INFLOW-ONLY STRL (TUBING) ×2 IMPLANT
WAND HAND CNTRL MULTIVAC 50 (MISCELLANEOUS) ×2 IMPLANT
WAND WEREWOLF FLOW 90D (MISCELLANEOUS) ×2 IMPLANT
WRAPON POLAR PAD KNEE (MISCELLANEOUS) ×2

## 2021-12-16 NOTE — Transfer of Care (Signed)
Immediate Anesthesia Transfer of Care Note ? ?Patient: Todd [name] ? ?Procedure(s) Performed: Right patellar tendon repair (Right: Knee) ? ?Patient Location: PACU ? ?Anesthesia Type:General ? ?Level of Consciousness: awake and drowsy ? ?Airway & Oxygen Therapy: Patient Spontanous Breathing and Patient connected to face mask oxygen ? ?Post-op Assessment: Report given to RN and Post -op Vital signs reviewed and stable ? ?Post vital signs: Reviewed and stable ? ?Last Vitals:  ?Vitals Value Taken Time  ?BP 158/93 12/16/21 1446  ?Temp 35.9   ?Pulse 82 12/16/21 1449  ?Resp 29 12/16/21 1449  ?SpO2 100 % 12/16/21 1449  ?Vitals shown include unvalidated device data. ? ?Last Pain:  ?Vitals:  ? 12/16/21 1150  ?TempSrc:   ?PainSc: 0-No pain  ?   ? ?  ? ?Complications: No notable events documented. ?

## 2021-12-16 NOTE — Op Note (Signed)
DATE OF SURGERY: 12/16/2021 ? ?PRE-OP DIAGNOSIS: Right Patellar Tendon Rupture ?  ?POST-OP DIAGNOSIS: Right Patellar Tendon Rupture ? ?PROCEDURES: Right Patellar Tendon Repair ? ?SURGEON: Cato Mulligan, MD ? ?ASSISTANT(S): none ? ?ANESTHESIA: regional + Gen ? ?TOTAL IV FLUIDS: see anesthesia record ? ?ESTIMATED BLOOD LOSS: 5cc ? ?DRAINS:  none ? ?SPECIMENS: None. ? ?IMPLANTS: Arthrex 4.76m SwiveLock anchors x 4 ? ?COMPLICATIONS: None apparent. ? ?INDICATIONS: ?Todd [name] a 41y.o. male with a patellar tendon rupture who sustained the injury after slipping on a step at home.  He was unable to bear weight afterwards.  Examination was notable for an obvious gap in the patellar tendon at the level of the patella and displacement of the patella proximally.  Diagnosis was confirmed with MRI.  The patient was unable to perform a straight leg raise. After discussion of risks, benefits, and alternatives to surgery, the patient elected to proceed with patellar tendon repair. ? ?DETAILS OF PROCEDURE: ?Todd CPelzerwas met in the preoperative holding area and informed consent was verified.  The patient was brought to the operating room and placed supine on the table. Anesthesia was administered. Leg was prescrubbed with Hibiclens and alcohol, prepped with ChloraPrep and draped in the usual sterile fashion. The patient was given preoperative IV antibiotics within 30 minutes of the start of the case, and a surgical time-out occurred. A well-padded tourniquet was placed.  ? ?The leg was elevated, exsanguinated with an Esmarch bandage and tourniquet inflated. A midline incision was created on the knee from the inferior pole of the patella to the tibial tubercle. The retinacular layer was developed, medial and lateral, in line with the skin incision. At that point, an obvious tear of the patellar tendon was notable.  It was a mid substance tear.  Medial and lateral retinacular tears were also identified. Edges of the  patellar tendon were debrided to healthy tendon. ? ?2 sets of suture tape were passed in a locking Krak?w fashion in the proximal patellar tendon, 1 medially and 1 laterally such that there were 4 strands in the proximal segment.  This was repeated in the distal patellar tendon.  Next, 2 SwiveLock anchors were placed in the inferior patella, 1 medially and 1 laterally.  These were loaded with FiberTape to serve as an internal brace.  These FiberTapes were brought from posterior to anterior using a fiber loop.  The wound was then thoroughly irrigated. Next, attention was pulled on the proximal suture tapes to bring the patella out of patella alta.  Proximal sutures were tied to their corresponding sutures distally allowing for an excellent repair of the patellar tendon itself.  This was performed in full extension next, the knee was brought into approximately 30 degrees knee flexion.  There was no gapping of the patellar tendon.  One strand of each of the medial and lateral fiber tapes was loaded onto another SwiveLock anchor. This was appropriately drilled and placed just medial to the tibial tubercle.  This process was repeated for a lateral SwiveLock anchor.  This served as the internal brace construct and served as a check rein to greater than 30 degrees knee flexion. ? ?Next, the medial and lateral retinacular layers were closed with #1 Vicryl suture in a figure of 8 fashion. The wound was irrigated again.  The patient could easily reach 30 degrees of flexion before there was a increase in tension.  There was no gap formation about the patellar tendon. 2-0 Vicryl was used to close  the subdermal layer tissue. Staples were used to close skin. Xeroform gauze and sterile dressing was applied.  Tourniquet was let down. PolarCare and hinged knee brace locked at 0 degrees were applied. Instrument, sponge, and needle counts were correct prior to wound closure and at the conclusion of the case. The patient was then  awakened from anesthesia without complication. ? ?Of note, this case had significantly added complexity compared to standard patellar tendon repair.  Given the patient's weight of over 160 kg, decision was made to further reinforce patellar tendon repair using internal brace mechanism with fiber tape.  This was performed to reduce the chance of patellar tendon rerupture.  This construct required use of an additional 4 implants, requiring additional exposure and instrumentation.  This process added approximately 45 minutes to the standard surgical time for patellar tendon repair. ? ?POST-OPERATIVE PLAN: ?- ASA 359m/day x 4 weeks for DVT ppx ?- WBAT on operative lower extremity with brace locked in extension x 6 weeks ?- PT/OT to start on POD #3-5 ?- Follow-up with me in approximately 2 weeks ? ?

## 2021-12-16 NOTE — Anesthesia Preprocedure Evaluation (Signed)
Anesthesia Evaluation  ?Patient identified by MRN, date of birth, ID band ?Patient awake ? ? ? ?Reviewed: ?Allergy & Precautions, NPO status , Patient's Chart, lab work & pertinent test results ? ?History of Anesthesia Complications ?Negative for: history of anesthetic complications ? ?Airway ?Mallampati: II ? ?TM Distance: >3 FB ?Neck ROM: Full ? ? ? Dental ?no notable dental hx. ?(+) Teeth Intact ?  ?Pulmonary ?neg pulmonary ROS, neg sleep apnea, neg COPD, Patient abstained from smoking.Not current smoker,  ?  ?Pulmonary exam normal ?breath sounds clear to auscultation ? ? ? ? ? ? Cardiovascular ?Exercise Tolerance: Good ?METS(-) hypertension(-) CAD and (-) Past MI negative cardio ROS ? ?(-) dysrhythmias  ?Rhythm:Regular Rate:Normal ?- Systolic murmurs ? ?  ?Neuro/Psych ?negative neurological ROS ? negative psych ROS  ? GI/Hepatic ?neg GERD  ,(+)  ?  ? (-) substance abuse ? ,   ?Endo/Other  ?neg diabetesMorbid obesity ? Renal/GU ?negative Renal ROS  ? ?  ?Musculoskeletal ? ?(+) Arthritis ,  ? Abdominal ?(+) + obese,   ?Peds ? Hematology ?  ?Anesthesia Other Findings ?Past Medical History: ?No date: Arthritis ? Reproductive/Obstetrics ? ?  ? ? ? ? ? ? ? ? ? ? ? ? ? ?  ?  ? ? ? ? ? ? ? ? ?Anesthesia Physical ?Anesthesia Plan ? ?ASA: 3 ? ?Anesthesia Plan: General  ? ?Post-op Pain Management: Ofirmev IV (intra-op)*, Toradol IV (intra-op)* and Regional block*  ? ?Induction: Intravenous ? ?PONV Risk Score and Plan: 3 and Ondansetron, Dexamethasone and Midazolam ? ?Airway Management Planned: Oral ETT and Video Laryngoscope Planned ? ?Additional Equipment: None ? ?Intra-op Plan:  ? ?Post-operative Plan: Extubation in OR ? ?Informed Consent: I have reviewed the patients History and Physical, chart, labs and discussed the procedure including the risks, benefits and alternatives for the proposed anesthesia with the patient or authorized representative who has indicated his/her understanding  and acceptance.  ? ? ? ?Dental advisory given ? ?Plan Discussed with: CRNA and Surgeon ? ?Anesthesia Plan Comments: (Discussed risks of anesthesia with patient, including PONV, sore throat, lip/dental/eye damage. Rare risks discussed as well, such as cardiorespiratory and neurological sequelae, and allergic reactions. Discussed the role of CRNA in patient's perioperative care. Patient understands. ?Discussed r/b/a of adductor canal nerve block, including:  ?- bleeding, infection, nerve damage ?- poor or non functioning block. ?- reactions and toxicity to local anesthetic ?Patient understands. ?)  ? ? ? ? ? ? ?Anesthesia Quick Evaluation ? ?

## 2021-12-16 NOTE — Discharge Instructions (Addendum)
Post-Op Instructions - Quadriceps/Patellar Tendon Repair ? ?1. Bracing or crutches: You will be provided with a long brace (from hip to ankle) and crutches. Alternatively, you may use a walker. ? ?2. Ice: You will be provided with a device [facility]) that allows you to ice the affected area effectively.  ? ?3. Showering: Incision must remain dry for 5 days. Afterwards, you may shower and gently pat incision dry. NO submerging wound for 4 weeks. Staples will be removed at your first post-operative appointment in 2 weeks.  ? ?4. Driving: You will be given specific driving precautions at discharge. Plan on not driving for at least one week for left knee surgery, and 4-6 weeks for right knee surgery if you are restricted due to the brace and knee motion. Please note that you are advised NOT to drive while taking narcotic pain medications as you may be impaired and unsafe to drive. ? ?5. Activity: Weight bearing: Weight bearing as tolerated with brace locked in extension. Bearing weight with brace unlocked (or with bent knee) will disrupt the repair. Bending the knee is limited and will be guided by the physical therapist. Elevate knee above heart level as much as possible for one week. Avoid standing more than 5 minutes (consecutively) for the first week. No exercise involving the knee until cleared by the surgeon or physical therapist.  Avoid long distance travel for 4 weeks. ? ?6. Medications: ?- You have been provided a prescription for narcotic pain medicine. After surgery, take 1-2 narcotic tablets every 4 hours if needed for severe pain.  ?- A prescription for anti-nausea medication will be provided in case the narcotic medicine causes nausea - take 1 tablet every 6 hours only if nauseated.  ?- Take aspirin 325mg  daily for 4 weeks to prevent blood clots.  ?-Take tylenol 1000 every 8 hours for pain.  May stop tylenol 3 days after surgery or when you are having minimal pain. ?-DO NOT TAKE IBUPROFEN, ALEVE or OTHER  NSAIDs as they can interfere with healing.  ? ? ?If you are taking prescription medication for anxiety, depression, insomnia, muscle spasm, chronic pain, or for attention deficit disorder, you are advised that you are at a higher risk of adverse effects with use of narcotics post-op, including narcotic addiction/dependence, depressed breathing, death. ?If you use non-prescribed substances: alcohol, marijuana, cocaine, heroin, methamphetamines, etc., you are at a higher risk of adverse effects with use of narcotics post-op, including narcotic addiction/dependence, depressed breathing, death. ?You are advised that taking > 50 morphine milligram equivalents (MME) of narcotic pain medication per day results in twice the risk of overdose or death. For your prescription provided: oxycodone 5 mg - taking more than 6 tablets per day would result in > 50 morphine milligram equivalents (MME) of narcotic pain medication. ?Be advised that we will prescribe narcotics short-term, for acute post-operative pain, only 3 weeks for major operations such as knee repair/reconstruction surgeries.  ? ?7. Bandages: The physical therapist should change the bandages at the first post-op appointment. If needed, the dressing supplies have been provided to you. ? ?8. Physical Therapy: 2 times per week for the first 4 weeks, then 1-2 times per week from weeks 4-8 post-op. Therapy typically starts on post operative Day 3 or 4. You have been provided an order for physical therapy today and should schedule your appointments in advance to avoid delay. The therapist will provide home exercises. ? ?9. Work or School: For most, but not all procedures, we advise staying  out of work or school for at least 1 to 2 weeks in order to recover from the stress of surgery and to allow time for healing and swelling control. If you need a work or school note this can be provided.  ? ?10. Post-Op Appointments: ?Your first post-op appointment will be with Dr. Posey Pronto  in approximately 2 weeks time. Please double check if this will be at the [facility] facility (Tuesdays and Thursdays) or [facility] [facility] facility (Wednesdays).  ?  ?If you find that they have not been scheduled please call the Orthopaedic Appointment front desk at [phone]. ? ?AMBULATORY SURGERY  ?DISCHARGE INSTRUCTIONS ? ? ?The drugs that you were given will stay in your system until tomorrow so for the next 24 hours you should not: ? ?Drive an automobile ?Make any legal decisions ?Drink any alcoholic beverage ? ? ?You may resume regular meals tomorrow.  Today it is better to start with liquids and gradually work up to solid foods. ? ?You may eat anything you prefer, but it is better to start with liquids, then soup and crackers, and gradually work up to solid foods. ? ? ?Please notify your doctor immediately if you have any unusual bleeding, trouble breathing, redness and pain at the surgery site, drainage, fever, or pain not relieved by medication. ? ? ? ?Additional Instructions: ? ? ? ? ?Please contact your physician with any problems or Same Day Surgery at [phone], Monday through Friday 6 am to 4 pm, or [place] at [facility] number at [phone].  ? ?POLAR CARE INFORMATION ? ?[email or web address] ? ?How to use [address]?  YouTube   [email or web address] ? ?OPERATING INSTRUCTIONS ? ?Start the product ?With dry hands, connect the transformer to the electrical connection located on the top of the cooler. Next, plug the transformer into an appropriate electrical outlet. The unit will automatically start running at this point. ? ?To stop the pump, disconnect electrical power.  ?Unplug to stop the product when not in use. Unplugging the Polar Care unit turns it off. Always unplug immediately after use. Never leave it plugged in while unattended. Remove pad.  ?  ?FIRST ADD WATER TO FILL LINE, THEN ICE---Replace ice when existing ice is almost melted ? ?1  Discuss Treatment with your Licensed Health Care Practitioner and Use Only as Prescribed ?2 Apply Insulation Barrier & Cold Therapy Pad ?3 Check for Moisture ?4 Inspect Skin Regularly ? ?Tips and Armed forces technical officer Tips ?1. Use cubed or chunked ice for optimal performance. ?2. It is recommended to drain the Pad between uses. To drain the pad, hold the Pad upright with the hose pointed toward the ground. Depress the black plunger and allow water to drain out. ?3. You may disconnect the Pad from the unit without removing the pad from the affected area by depressing the silver tabs on the hose coupling and gently pulling the hoses apart. The Pad and unit will seal itself and will not leak. Note: Some dripping during release is normal. ?4. DO NOT RUN PUMP WITHOUT WATER! The pump in this unit is designed to run with water. Running the unit without water will cause permanent damage to the pump. ?5. Unplug unit before removing lid. ? ?TROUBLESHOOTING GUIDE ?Pump not running, Water not flowing to the pad, Pad is not getting cold ?1. Make sure the transformer is plugged into the wall outlet. ?2. Confirm that the ice and water are filled to the indicated levels. ?3. Make sure there are  no kinks in the pad. ?4. Gently pull on the blue tube to make sure the tube/pad junction is straight. ?5. Remove the pad from the treatment site and ll it while the pad is lying at; then reapply. ?6. Confirm that the pad couplings are securely attached to the unit. Listen for the double clicks (Figure 1) to confirm the pad couplings are securely attached. ? ?Leaks    Note: Some condensation on the lines, controller, and pads is unavoidable, especially in warmer climates. ?1. If using a Breg Polar Care Cold Therapy unit with a detachable Cold Therapy Pad, and a leak exists (other than condensation on the lines) disconnect the pad couplings. Make sure the silver tabs on the couplings are depressed before reconnecting the pad to the pump hose;  then confirm both sides of the coupling are properly clicked in. ?2. If the coupling continues to leak or a leak is detected in the pad itself, stop using it and call [facility] at [phone].

## 2021-12-16 NOTE — Anesthesia Procedure Notes (Signed)
Anesthesia Regional Block: Adductor canal block  ? ?Pre-Anesthetic Checklist: , timeout performed,  Correct Patient, Correct Site, Correct Laterality,  Correct Procedure, Correct Position, site marked,  Risks and benefits discussed,  Surgical consent,  Pre-op evaluation,  At surgeon's request and post-op pain management ? ?Laterality: Right ? ?Prep: chloraprep     ?  ?Needles:  ?Injection technique: Single-shot ? ?Needle Type: Echogenic Needle   ? ? ?Needle Length: 9cm  ?Needle Gauge: 21  ? ? ? ?Additional Needles: ? ? ?Procedures:,,,, ultrasound used (permanent image in chart),,    ?Narrative:  ?Injection made incrementally with aspirations every 5 mL. ? ?Performed by: Personally  ?Anesthesiologist: Corinda Gubler, MD ? ?Additional Notes: ?Patient's chart reviewed and they were deemed appropriate candidate for procedure, per surgeon's request. Patient educated about risks, benefits, and alternatives of the block including but not limited to: temporary or permanent nerve damage, bleeding, infection, damage to surround tissues, block failure, local anesthetic toxicity. Patient expressed understanding. A formal time-out was conducted consistent with institution rules. ? ?Monitors were applied, and minimal sedation used (see nursing record). The site was prepped with skin prep and allowed to dry, and sterile gloves were used. A high frequency linear ultrasound probe with probe cover was utilized throughout. Femoral artery visualized at mid-thigh level, local anesthetic injected anterolateral to it, and echogenic block needle trajectory was monitored throughout. Hydrodissection of saphenous nerve visualized and appeared anatomically normal. Aspiration performed every 27ml. Blood vessels were avoided. All injections were performed without resistance and free of blood and paresthesias. The patient tolerated the procedure well. ? ?Injectate: 30ml 0.5% ropivacaine + 12ml sterile water + 4mg  decadron  ? ? ? ?

## 2021-12-16 NOTE — Anesthesia Procedure Notes (Signed)
Procedure Name: Intubation ?Date/Time: 12/16/2021 12:06 PM ?Performed by: Morene Crocker, CRNA ?Pre-anesthesia Checklist: Patient identified, Patient being monitored, Timeout performed, Emergency Drugs available and Suction available ?Patient Re-evaluated:Patient Re-evaluated prior to induction ?Oxygen Delivery Method: Circle system utilized ?Preoxygenation: Pre-oxygenation with 100% oxygen ?Induction Type: IV induction ?Ventilation: Mask ventilation without difficulty ?Laryngoscope Size: McGraph and 4 ?Grade View: Grade II ?Tube type: Oral ?Tube size: 7.5 mm ?Number of attempts: 1 ?Airway Equipment and Method: Stylet ?Placement Confirmation: ETT inserted through vocal cords under direct vision, positive ETCO2 and breath sounds checked- equal and bilateral ?Secured at: 23 cm ?Tube secured with: Tape ?Dental Injury: Teeth and Oropharynx as per pre-operative assessment  ? ? ? ? ?

## 2021-12-16 NOTE — H&P (Signed)
Paper H&P to be scanned into permanent record. H&P reviewed. No significant changes noted.  

## 2021-12-17 ENCOUNTER — Encounter: Payer: Self-pay | Admitting: Orthopedic Surgery

## 2021-12-18 NOTE — Anesthesia Postprocedure Evaluation (Signed)
Anesthesia Post Note ? ?Patient: Todd [name] ? ?Procedure(s) Performed: Right patellar tendon repair (Right: Knee) ? ?Patient location during evaluation: PACU ?Anesthesia Type: General ?Level of consciousness: awake and alert ?Pain management: pain level controlled ?Vital Signs Assessment: post-procedure vital signs reviewed and stable ?Respiratory status: spontaneous breathing, nonlabored ventilation, respiratory function stable and patient connected to nasal cannula oxygen ?Cardiovascular status: blood pressure returned to baseline and stable ?Postop Assessment: no apparent nausea or vomiting ?Anesthetic complications: no ? ? ?No notable events documented. ? ? ?Last Vitals:  ?Vitals:  ? 12/16/21 1614 12/16/21 1624  ?BP: (!) 181/91 (!) 148/84  ?Pulse: 76   ?Resp: 20   ?Temp: (!) 36.1 ?C   ?SpO2: 97%   ?  ?Last Pain:  ?Vitals:  ? 12/17/21 0957  ?TempSrc:   ?PainSc: 3   ? ? ?  ?  ?  ?  ?  ?  ? ?Lenard Simmer ? ? ? ? ?

## 2021-12-24 ENCOUNTER — Encounter: Payer: Self-pay | Admitting: Orthopedic Surgery

## 2022-12-21 IMAGING — MR MR KNEE*R* W/O CM
7 series · 40 of 40 positions shown · non-contrast
Comparison: Radiographs dated December 08, 2021

CLINICAL DATA: Knee trauma mechanical fall, right knee pain.

EXAM:
MRI OF THE RIGHT KNEE WITHOUT CONTRAST
TECHNIQUE: Multiplanar, multisequence MR imaging of the right knee was
performed. No intravenous contrast was administered.

[Series 8: T1 · coronal · right · 3.0mm · 0.53mm/px · 5 of 48 slices shown]
[im 1/48]
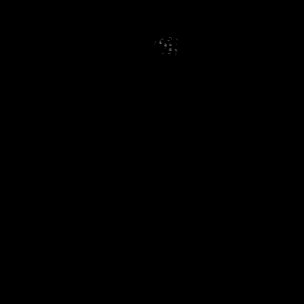
[im 12/48]
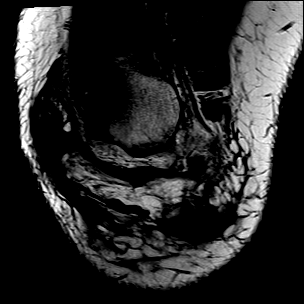
[im 24/48]
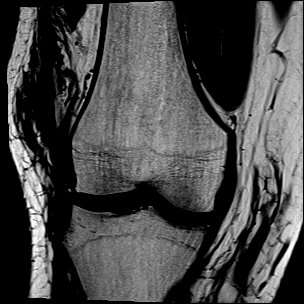
[im 36/48]
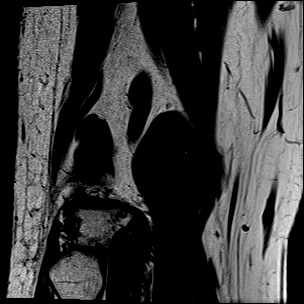
[im 48/48]
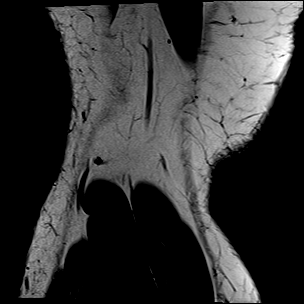

[Series 9: T2 fat-sat · coronal · right · 3.0mm · 0.62mm/px · 6 of 48 slices shown (1 of 2)]
[im 1/48]
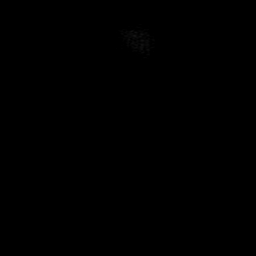
[im 10/48]
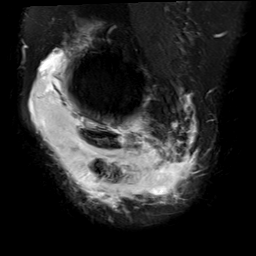
[im 19/48]
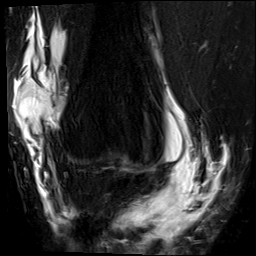
[im 29/48]
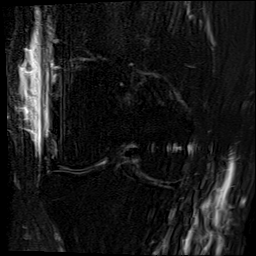
[im 38/48]
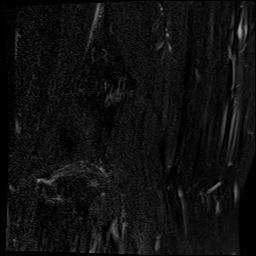
[im 48/48]
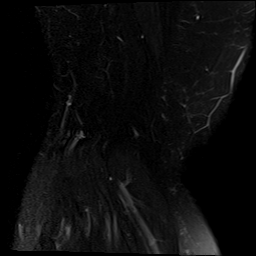

[Series 10: PD fat-sat · sagittal · right · 2.5mm · 0.56mm/px · 6 of 50 slices shown (1 of 4)]
[im 1/50]
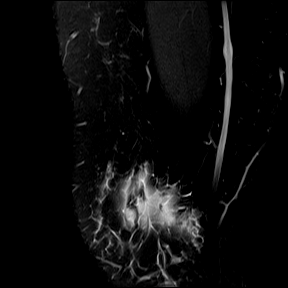
[im 10/50]
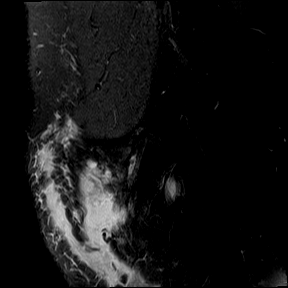
[im 20/50]
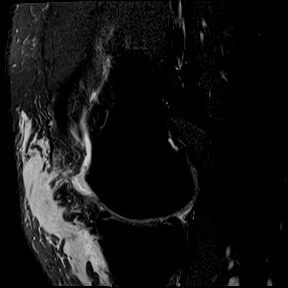
[im 30/50]
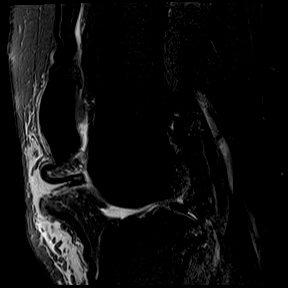
[im 40/50]
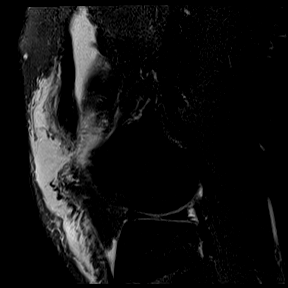
[im 50/50]
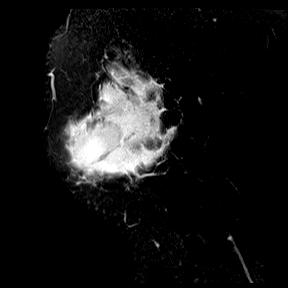

[Series 11: PD fat-sat · axial · right · 3.0mm · 0.50mm/px · z∈[-58,+69]mm · 5 of 40 slices shown (2 of 4)]
[im 1/40]
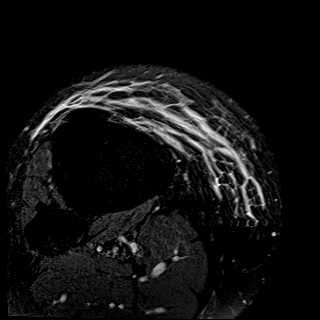
[im 10/40]
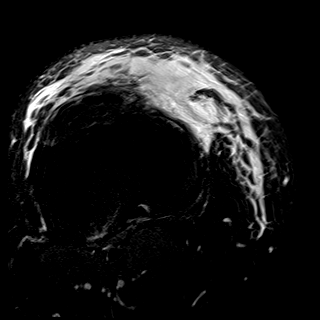
[im 20/40]
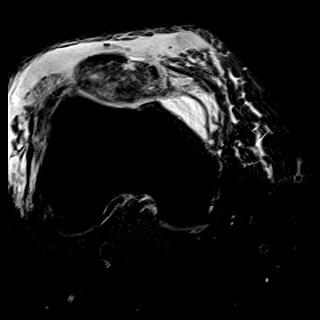
[im 30/40]
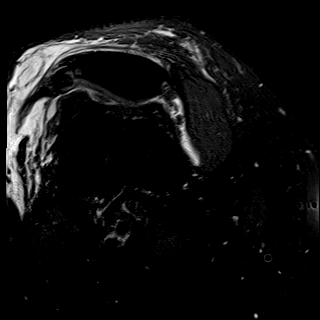
[im 40/40]
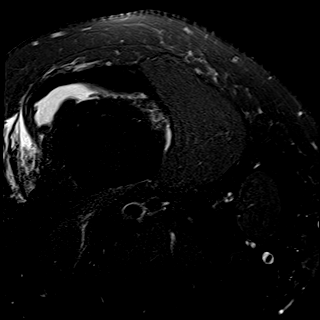

[Series 12: PD fat-sat · coronal · right · 2.5mm · 0.56mm/px · 6 of 50 slices shown (3 of 4)]
[im 1/50]
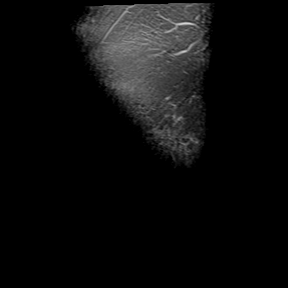
[im 10/50]
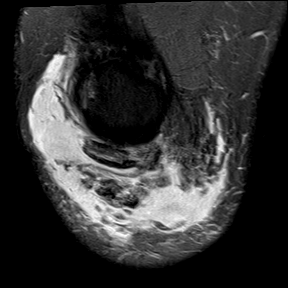
[im 20/50]
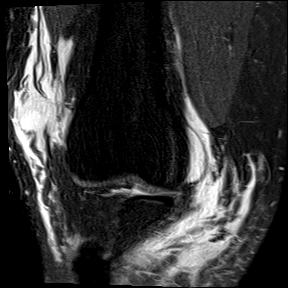
[im 30/50]
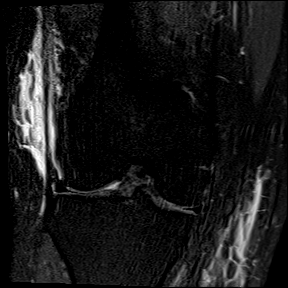
[im 40/50]
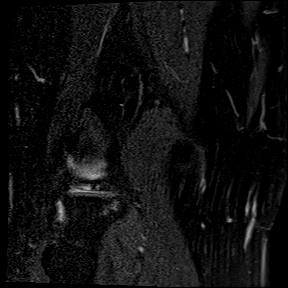
[im 50/50]
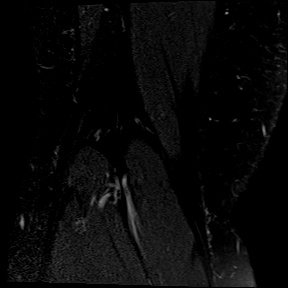

[Series 13: T2 fat-sat · sagittal · right · 2.5mm · 0.50mm/px · 6 of 50 slices shown (2 of 2)]
[im 1/50]
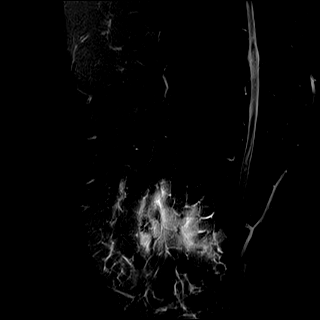
[im 10/50]
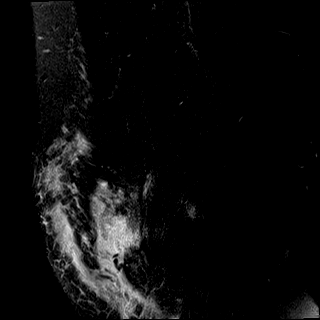
[im 20/50]
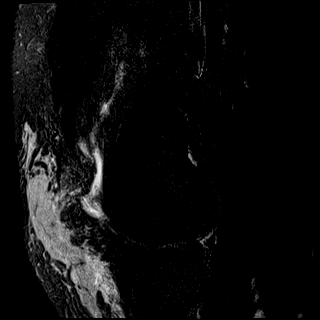
[im 30/50]
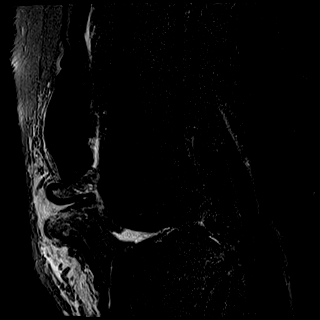
[im 40/50]
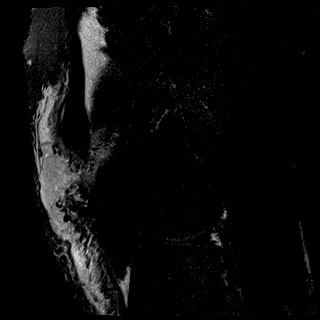
[im 50/50]
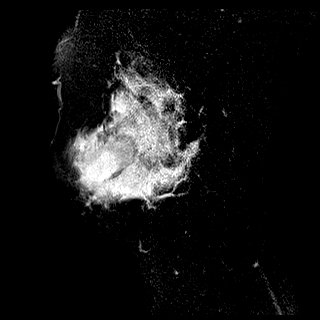

[Series 14: PD fat-sat · axial · right · 2.5mm · 0.62mm/px · z∈[-72,+74]mm · 6 of 54 slices shown (4 of 4)]
[im 1/54]
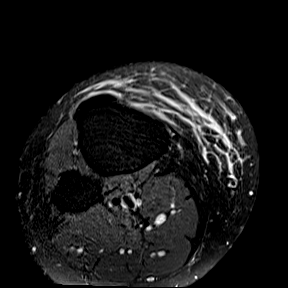
[im 11/54]
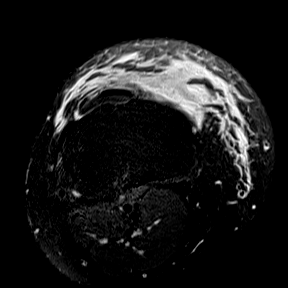
[im 22/54]
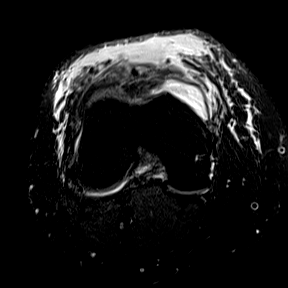
[im 32/54]
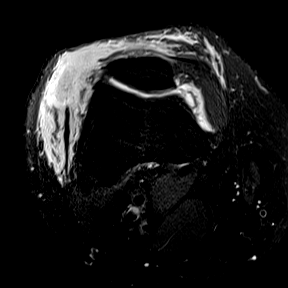
[im 43/54]
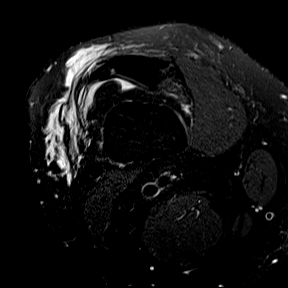
[im 54/54]
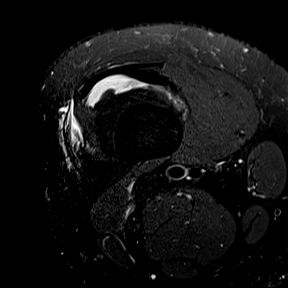

[40 of 40 positions shown; findings below may reference images not displayed]

FINDINGS: Multiple sequences are limited due to motion.

MENISCI

Medial: Intact.

Lateral: Intact.

LIGAMENTS

Cruciates: ACL and PCL are intact.

Collaterals: Medial collateral ligament is intact. Lateral
collateral ligament complex is intact.

CARTILAGE

Patellofemoral: Focal full-thickness defect in the lateral patellar
facet.

Medial:  No chondral defect.

Lateral:  No chondral defect.

JOINT: Large joint effusion. Marked edema of the Hoffa's fat pad. No
plical thickening.

POPLITEAL FOSSA: Popliteus tendon is intact. No Baker's cyst.

EXTENSOR MECHANISM: Intact quadriceps tendon. Full-thickness tear of
the mid patellar tendon with tortuous appearance of the tendon
fibers. Marked surrounding edema. There is also irregular
appearance of the lateral patellar retinaculum concerning for
full-thickness tear. Intact medial patellar retinaculum. Intact
MPFL.

BONES: No aggressive osseous lesion. No fracture or dislocation.

Other: Subcutaneous edema with small fluid collections in the
anterior/lateral aspect of the knee, likely reactive secondary to
patellar tendon tear.
IMPRESSION: 1. Full-thickness tear of the patellar tendon with marked
surrounding edema/small fluid collections around the anterior
lateral aspect of the knee.

2. Superior migration of the patella consistent with patella Alta
secondary to the patellar tendon tear.

3.  Full-thickness tear of the lateral patellar retinaculum.

4. Focal full-thickness chondral defect in the lateral patellar
facet with moderate joint effusion.

5.  Menisci, cruciate ligaments and collateral ligaments are intact.

## 2023-03-01 ENCOUNTER — Ambulatory Visit: Payer: 59 | Admitting: Internal Medicine

## 2023-03-01 ENCOUNTER — Encounter: Payer: Self-pay | Admitting: Internal Medicine

## 2023-03-01 VITALS — BP 162/118 | HR 77 | Ht 77.0 in | Wt >= 6400 oz

## 2023-03-01 DIAGNOSIS — I10 Essential (primary) hypertension: Secondary | ICD-10-CM | POA: Insufficient documentation

## 2023-03-01 DIAGNOSIS — E291 Testicular hypofunction: Secondary | ICD-10-CM | POA: Insufficient documentation

## 2023-03-01 DIAGNOSIS — Z6841 Body Mass Index (BMI) 40.0 and over, adult: Secondary | ICD-10-CM

## 2023-03-01 DIAGNOSIS — N528 Other male erectile dysfunction: Secondary | ICD-10-CM

## 2023-03-01 MED ORDER — TESTOSTERONE 20.25 MG/1.25GM (1.62%) TD GEL: 2.0000 | Freq: Every day | TRANSDERMAL | 2 refills | Status: DC

## 2023-03-01 MED ORDER — ZEPBOUND 2.5 MG/0.5ML ~~LOC~~ SOAJ: 2.5000 mg | SUBCUTANEOUS | 0 refills | Status: DC

## 2023-03-01 MED ORDER — SILDENAFIL CITRATE 100 MG PO TABS: 100.0000 mg | ORAL_TABLET | ORAL | 1 refills | Status: DC | PRN

## 2023-03-01 MED ORDER — AMLODIPINE-OLMESARTAN 5-40 MG PO TABS: 1.0000 | ORAL_TABLET | Freq: Every day | ORAL | 0 refills | Status: DC

## 2023-03-01 NOTE — Progress Notes (Signed)
Established Patient Office Visit  Subjective:  Patient ID: Todd [name], male    DOB: 12-Nov-1980  Age: 42 y.o. MRN: [id number]  Chief Complaint  Patient presents with   Anxiety    Started a few nights ago, woke in middle of night.    BP very high today and also continues to gain weight. Denies headache or dizziness and also c/o lower back pain. Yet to undergo sleep study and still c/o daytime fatigue.    No other concerns at this time.   Past Medical History:  Diagnosis Date   Arthritis     Past Surgical History:  Procedure Laterality Date   PATELLAR TENDON REPAIR Right 12/16/2021   Procedure: Right patellar tendon repair;  Surgeon: Signa Kell, MD;  Location: [facility] ORS;  Service: Orthopedics;  Laterality: Right;    Social History   Socioeconomic History   Marital status: Significant Other    Spouse name: Not on file   Number of children: 3   Years of education: Not on file   Highest education level: Not on file  Occupational History   Not on file  Tobacco Use   Smoking status: Never   Smokeless tobacco: Not on file  Substance and Sexual Activity   Alcohol use: Not Currently   Drug use: Never   Sexual activity: Not on file  Other Topics Concern   Not on file  Social History Narrative   Not on file   Social Determinants of Health   Financial Resource Strain: Not on file  Food Insecurity: Not on file  Transportation Needs: Not on file  Physical Activity: Not on file  Stress: Not on file  Social Connections: Not on file  Intimate Partner Violence: Not on file    No family history on file.  No Known Allergies  Review of Systems  Constitutional: Negative.   HENT: Negative.    Eyes: Negative.   Respiratory: Negative.    Cardiovascular: Negative.   Gastrointestinal: Negative.   Genitourinary: Negative.   Musculoskeletal:  Positive for back pain.  Skin: Negative.   Neurological: Negative.   Endo/Heme/Allergies: Negative.        Objective:    BP (!) 162/118   Pulse 77   Ht 6\' 5"  (1.956 m)   Wt (!) 412 lb (186.9 kg)   SpO2 95%   BMI 48.86 kg/m   Vitals:   03/01/23 1110  BP: (!) 162/118  Pulse: 77  Height: 6\' 5"  (1.956 m)  Weight: (!) 412 lb (186.9 kg)  SpO2: 95%  BMI (Calculated): 48.85    Physical Exam Vitals reviewed.  Constitutional:      Appearance: Normal appearance. He is morbidly obese.  HENT:     Head: Normocephalic.     Left Ear: There is no impacted cerumen.     Nose: Nose normal.     Mouth/Throat:     Mouth: Mucous membranes are moist.     Pharynx: No posterior oropharyngeal erythema.  Eyes:     Extraocular Movements: Extraocular movements intact.     Pupils: Pupils are equal, round, and reactive to light.  Cardiovascular:     Rate and Rhythm: Regular rhythm.     Chest Wall: PMI is not displaced.     Pulses: Normal pulses.     Heart sounds: Normal heart sounds. No murmur heard. Pulmonary:     Effort: Pulmonary effort is normal.     Breath sounds: Normal air entry. No rhonchi or rales.  Abdominal:  General: Abdomen is flat. Bowel sounds are normal. There is no distension.     Palpations: Abdomen is soft. There is no hepatomegaly, splenomegaly or mass.     Tenderness: There is no abdominal tenderness.  Musculoskeletal:        General: Normal range of motion.     Cervical back: Normal range of motion and neck supple.     Right lower leg: No edema.     Left lower leg: No edema.  Skin:    General: Skin is warm and dry.  Neurological:     General: No focal deficit present.     Mental Status: He is alert and oriented to person, place, and time.     Cranial Nerves: No cranial nerve deficit.     Motor: No weakness.  Psychiatric:        Mood and Affect: Mood normal.        Behavior: Behavior normal.      No results found for any visits on 03/01/23.  No results found for this or any previous visit (from the past 2160 hour(Todd [name])).    Assessment & Plan:   As per problem  list  Problem List Items Addressed This Visit       Cardiovascular and Mediastinum   Severe hypertension - Primary   Relevant Medications   amLODipine-olmesartan (AZOR) 5-40 MG tablet   sildenafil (VIAGRA) 100 MG tablet     Endocrine   Hypogonadism in male   Relevant Medications   Testosterone 20.25 MG/1.25GM (1.62%) GEL     Other   Morbid obesity with BMI of 45.0-49.9, adult (HCC)   Relevant Medications   tirzepatide (ZEPBOUND) 2.5 MG/0.5ML Pen   Other male erectile dysfunction   Relevant Medications   sildenafil (VIAGRA) 100 MG tablet    Return in about 2 weeks (around 03/15/2023) for BP followup.   Total time spent: 30 minutes  Luna Fuse, MD  03/01/2023   This document may have been prepared by [facility] Voice Recognition software and as such may include unintentional dictation errors.

## 2023-03-02 ENCOUNTER — Telehealth: Payer: Self-pay | Admitting: Internal Medicine

## 2023-03-02 NOTE — Telephone Encounter (Signed)
Patient left VM requesting call back. Did not mention what the call was for.

## 2023-03-06 NOTE — Telephone Encounter (Signed)
Needing a PA, we got it Friday I will work on this today

## 2023-03-09 NOTE — Telephone Encounter (Signed)
[  Patient will pay cash for these meds

## 2023-03-22 ENCOUNTER — Ambulatory Visit: Payer: 59 | Admitting: Internal Medicine

## 2023-03-22 DIAGNOSIS — E291 Testicular hypofunction: Secondary | ICD-10-CM

## 2023-03-22 DIAGNOSIS — I10 Essential (primary) hypertension: Secondary | ICD-10-CM

## 2023-03-22 MED ORDER — AMLODIPINE-OLMESARTAN 5-40 MG PO TABS: 1.0000 | ORAL_TABLET | Freq: Every day | ORAL | 0 refills | Status: DC

## 2023-03-22 MED ORDER — TESTOSTERONE 20.25 MG/1.25GM (1.62%) TD GEL: 2.0000 | Freq: Every day | TRANSDERMAL | 0 refills | Status: DC

## 2023-03-22 NOTE — Progress Notes (Signed)
Established Patient Office Visit  Subjective:  Patient ID: Todd [name], male    DOB: Feb 04, 1981  Age: 42 y.o. MRN: [id number]  Chief Complaint  Patient presents with   Follow-up    2 week BP F/U    None of his meds were filled since his last visit due to varying insurance issues.    No other concerns at this time.   Past Medical History:  Diagnosis Date   Arthritis     Past Surgical History:  Procedure Laterality Date   PATELLAR TENDON REPAIR Right 12/16/2021   Procedure: Right patellar tendon repair;  Surgeon: Signa Kell, MD;  Location: [facility] ORS;  Service: Orthopedics;  Laterality: Right;    Social History   Socioeconomic History   Marital status: Significant Other    Spouse name: Not on file   Number of children: 3   Years of education: Not on file   Highest education level: Not on file  Occupational History   Not on file  Tobacco Use   Smoking status: Never   Smokeless tobacco: Not on file  Substance and Sexual Activity   Alcohol use: Not Currently   Drug use: Never   Sexual activity: Not on file  Other Topics Concern   Not on file  Social History Narrative   Not on file   Social Determinants of Health   Financial Resource Strain: Not on file  Food Insecurity: Not on file  Transportation Needs: Not on file  Physical Activity: Not on file  Stress: Not on file  Social Connections: Not on file  Intimate Partner Violence: Not on file    No family history on file.  No Known Allergies  Review of Systems  Constitutional: Negative.   HENT: Negative.    Eyes: Negative.   Respiratory: Negative.    Cardiovascular: Negative.   Gastrointestinal: Negative.   Genitourinary: Negative.   Musculoskeletal:  Positive for back pain.  Skin: Negative.   Neurological: Negative.   Endo/Heme/Allergies: Negative.        Objective:   BP (!) 160/120   Pulse (!) 49   Ht 6\' 5"  (1.956 m)   Wt (!) 412 lb 3.2 oz (187 kg)   SpO2 95%   BMI 48.88 kg/m    Vitals:   03/22/23 1013  BP: (!) 160/120  Pulse: (!) 49  Height: 6\' 5"  (1.956 m)  Weight: (!) 412 lb 3.2 oz (187 kg)  SpO2: 95%  BMI (Calculated): 48.87    Physical Exam Vitals reviewed.  Constitutional:      Appearance: Normal appearance. He is morbidly obese.  HENT:     Head: Normocephalic.     Left Ear: There is no impacted cerumen.     Nose: Nose normal.     Mouth/Throat:     Mouth: Mucous membranes are moist.     Pharynx: No posterior oropharyngeal erythema.  Eyes:     Extraocular Movements: Extraocular movements intact.     Pupils: Pupils are equal, round, and reactive to light.  Cardiovascular:     Rate and Rhythm: Regular rhythm.     Chest Wall: PMI is not displaced.     Pulses: Normal pulses.     Heart sounds: Normal heart sounds. No murmur heard. Pulmonary:     Effort: Pulmonary effort is normal.     Breath sounds: Normal air entry. No rhonchi or rales.  Abdominal:     General: Abdomen is flat. Bowel sounds are normal. There is no distension.  Palpations: Abdomen is soft. There is no hepatomegaly, splenomegaly or mass.     Tenderness: There is no abdominal tenderness.  Musculoskeletal:        General: Normal range of motion.     Cervical back: Normal range of motion and neck supple.     Right lower leg: No edema.     Left lower leg: No edema.  Skin:    General: Skin is warm and dry.  Neurological:     General: No focal deficit present.     Mental Status: He is alert and oriented to person, place, and time.     Cranial Nerves: No cranial nerve deficit.     Motor: No weakness.  Psychiatric:        Mood and Affect: Mood normal.        Behavior: Behavior normal.      No results found for any visits on 03/22/23.  No results found for this or any previous visit (from the past 2160 hour(Shatoya Roets)).    Assessment & Plan:  As per problem list  Problem List Items Addressed This Visit       Cardiovascular and Mediastinum   Severe hypertension    Relevant Medications   amLODipine-olmesartan (AZOR) 5-40 MG tablet     Endocrine   Hypogonadism in male   Relevant Medications   Testosterone 20.25 MG/1.25GM (1.62%) GEL    Return in about 2 weeks (around 04/05/2023).   Total time spent: 20 minutes  Luna Fuse, MD  03/22/2023   This document may have been prepared by [facility] Voice Recognition software and as such may include unintentional dictation errors.

## 2023-03-24 ENCOUNTER — Other Ambulatory Visit: Payer: Self-pay | Admitting: Internal Medicine

## 2023-03-24 DIAGNOSIS — E291 Testicular hypofunction: Secondary | ICD-10-CM

## 2023-03-24 MED ORDER — TESTOSTERONE 20.25 MG/ACT (1.62%) TD GEL: 2.0000 | Freq: Every day | TRANSDERMAL | 2 refills | Status: DC

## 2023-04-05 ENCOUNTER — Ambulatory Visit: Payer: 59 | Admitting: Internal Medicine

## 2023-04-05 VITALS — BP 140/90 | HR 71 | Ht 77.0 in | Wt >= 6400 oz

## 2023-04-05 DIAGNOSIS — Z6841 Body Mass Index (BMI) 40.0 and over, adult: Secondary | ICD-10-CM | POA: Diagnosis not present

## 2023-04-05 DIAGNOSIS — I10 Essential (primary) hypertension: Secondary | ICD-10-CM | POA: Diagnosis not present

## 2023-04-05 MED ORDER — OLMESARTAN MEDOXOMIL 40 MG PO TABS: 40.0000 mg | ORAL_TABLET | Freq: Every day | ORAL | 0 refills | Status: DC

## 2023-04-05 MED ORDER — AMLODIPINE BESYLATE 10 MG PO TABS: 10.0000 mg | ORAL_TABLET | Freq: Every day | ORAL | 0 refills | Status: DC

## 2023-04-05 NOTE — Progress Notes (Signed)
Established Patient Office Visit  Subjective:  Patient ID: Todd [name], male    DOB: Feb 08, 1981  Age: 42 y.o. MRN: [id number]  Chief Complaint  Patient presents with   Follow-up    2 week F/U    BP has improved but not fully controlled. Yet to start Zepbound due to insurance delays.    No other concerns at this time.   Past Medical History:  Diagnosis Date   Arthritis     Past Surgical History:  Procedure Laterality Date   PATELLAR TENDON REPAIR Right 12/16/2021   Procedure: Right patellar tendon repair;  Surgeon: Signa Kell, MD;  Location: [facility] OR[facility];  Service: Orthopedics;  Laterality: Right;    Social History   Socioeconomic History   Marital status: Significant Other    Spouse name: Not on file   Number of children: 3   Years of education: Not on file   Highest education level: Not on file  Occupational History   Not on file  Tobacco Use   Smoking status: Never   Smokeless tobacco: Not on file  Substance and Sexual Activity   Alcohol use: Not Currently   Drug use: Never   Sexual activity: Not on file  Other Topics Concern   Not on file  Social History Narrative   Not on file   Social Determinants of Health   Financial Resource Strain: Not on file  Food Insecurity: Not on file  Transportation Needs: Not on file  Physical Activity: Not on file  Stress: Not on file  Social Connections: Not on file  Intimate Partner Violence: Not on file    No family history on file.  No Known Allergies  Review of Systems  Constitutional: Negative.   HENT: Negative.    Eyes: Negative.   Respiratory: Negative.    Cardiovascular: Negative.   Gastrointestinal: Negative.   Genitourinary: Negative.   Musculoskeletal:  Positive for back pain.  Skin: Negative.   Neurological: Negative.   Endo/Heme/Allergies: Negative.        Objective:   BP (!) 140/90   Pulse 71   Ht 6\' 5"  (1.956 m)   Wt (!) 420 lb 9.6 oz (190.8 kg)   SpO2 98%   BMI 49.88 kg/m    Vitals:   04/05/23 1004  BP: (!) 140/90  Pulse: 71  Height: 6\' 5"  (1.956 m)  Weight: (!) 420 lb 9.6 oz (190.8 kg)  SpO2: 98%  BMI (Calculated): 49.87    Physical Exam Vitals reviewed.  Constitutional:      Appearance: Normal appearance. He is morbidly obese.  HENT:     Head: Normocephalic.     Left Ear: There is no impacted cerumen.     Nose: Nose normal.     Mouth/Throat:     Mouth: Mucous membranes are moist.     Pharynx: No posterior oropharyngeal erythema.  Eyes:     Extraocular Movements: Extraocular movements intact.     Pupils: Pupils are equal, round, and reactive to light.  Cardiovascular:     Rate and Rhythm: Regular rhythm.     Chest Wall: PMI is not displaced.     Pulses: Normal pulses.     Heart sounds: Normal heart sounds. No murmur heard. Pulmonary:     Effort: Pulmonary effort is normal.     Breath sounds: Normal air entry. No rhonchi or rales.  Abdominal:     General: Abdomen is flat. Bowel sounds are normal. There is no distension.  Palpations: Abdomen is soft. There is no hepatomegaly, splenomegaly or mass.     Tenderness: There is no abdominal tenderness.  Musculoskeletal:        General: Normal range of motion.     Cervical back: Normal range of motion and neck supple.     Right lower leg: No edema.     Left lower leg: No edema.  Skin:    General: Skin is warm and dry.  Neurological:     General: No focal deficit present.     Mental Status: He is alert and oriented to person, place, and time.     Cranial Nerves: No cranial nerve deficit.     Motor: No weakness.  Psychiatric:        Mood and Affect: Mood normal.        Behavior: Behavior normal.      No results found for any visits on 04/05/23.  No results found for this or any previous visit (from the past 2160 hour(Lam Bjorklund)).    Assessment & Plan:  As per problem list Increase amlodipine to optimize bp control. Problem List Items Addressed This Visit       Cardiovascular and  Mediastinum   Severe hypertension - Primary   Relevant Medications   amLODipine (NORVASC) 10 MG tablet   olmesartan (BENICAR) 40 MG tablet     Other   Morbid obesity with BMI of 45.0-49.9, adult (HCC)    Return in about 4 weeks (around 05/03/2023).   Total time spent: 20 minutes  Luna Fuse, MD  04/05/2023   This document may have been prepared by [facility] Voice Recognition software and as such may include unintentional dictation errors.

## 2023-04-18 ENCOUNTER — Other Ambulatory Visit: Payer: Self-pay | Admitting: Internal Medicine

## 2023-04-18 MED ORDER — ZEPBOUND 2.5 MG/0.5ML ~~LOC~~ SOAJ: 2.5000 mg | SUBCUTANEOUS | 0 refills | Status: AC

## 2023-04-18 MED ORDER — ZEPBOUND 5 MG/0.5ML ~~LOC~~ SOAJ: 5.0000 mg | SUBCUTANEOUS | 0 refills | Status: DC

## 2023-05-03 ENCOUNTER — Ambulatory Visit: Payer: Self-pay | Admitting: Physician Assistant

## 2023-05-09 ENCOUNTER — Ambulatory Visit: Payer: 59 | Admitting: Internal Medicine

## 2023-05-09 ENCOUNTER — Other Ambulatory Visit: Payer: Self-pay

## 2023-05-09 VITALS — BP 150/110 | HR 75 | Ht 77.0 in | Wt >= 6400 oz

## 2023-05-09 DIAGNOSIS — Z6841 Body Mass Index (BMI) 40.0 and over, adult: Secondary | ICD-10-CM

## 2023-05-09 DIAGNOSIS — N528 Other male erectile dysfunction: Secondary | ICD-10-CM

## 2023-05-09 DIAGNOSIS — E291 Testicular hypofunction: Secondary | ICD-10-CM | POA: Diagnosis not present

## 2023-05-09 DIAGNOSIS — I10 Essential (primary) hypertension: Secondary | ICD-10-CM | POA: Diagnosis not present

## 2023-05-09 MED ORDER — AMLODIPINE BESYLATE 10 MG PO TABS: 10.0000 mg | ORAL_TABLET | Freq: Every day | ORAL | 2 refills | Status: DC

## 2023-05-09 MED ORDER — OLMESARTAN MEDOXOMIL 40 MG PO TABS: 40.0000 mg | ORAL_TABLET | Freq: Every day | ORAL | 2 refills | Status: DC

## 2023-05-09 NOTE — Progress Notes (Unsigned)
Established Patient Office Visit  Subjective:  Patient ID: Todd [name], male    DOB: 22-Apr-1981  Age: 42 y.o. MRN: [id number]  Chief Complaint  Patient presents with   Follow-up    1 MO F/U    No new complaints, here for weight follow up and refills. Pharmacy only filled 30 of his 90 day supply so hasn't taken his antihypertensives in 5 days.     No other concerns at this time.   Past Medical History:  Diagnosis Date   Arthritis     Past Surgical History:  Procedure Laterality Date   PATELLAR TENDON REPAIR Right 12/16/2021   Procedure: Right patellar tendon repair;  Surgeon: Signa Kell, MD;  Location: [facility] OR[facility];  Service: Orthopedics;  Laterality: Right;    Social History   Socioeconomic History   Marital status: Significant Other    Spouse name: Not on file   Number of children: 3   Years of education: Not on file   Highest education level: Not on file  Occupational History   Not on file  Tobacco Use   Smoking status: Never   Smokeless tobacco: Not on file  Substance and Sexual Activity   Alcohol use: Not Currently   Drug use: Never   Sexual activity: Not on file  Other Topics Concern   Not on file  Social History Narrative   Not on file   Social Determinants of Health   Financial Resource Strain: Not on file  Food Insecurity: Not on file  Transportation Needs: Not on file  Physical Activity: Not on file  Stress: Not on file  Social Connections: Not on file  Intimate Partner Violence: Not on file    No family history on file.  No Known Allergies  Review of Systems  Constitutional: Negative.   HENT: Negative.    Eyes: Negative.   Respiratory: Negative.    Cardiovascular: Negative.   Gastrointestinal: Negative.   Genitourinary: Negative.   Musculoskeletal:  Positive for back pain.  Skin: Negative.   Neurological: Negative.   Endo/Heme/Allergies: Negative.        Objective:   BP (!) 150/110   Pulse 75   Ht 6\' 5"  (1.956 m)   Wt  (!) 425 lb (192.8 kg)   SpO2 94%   BMI 50.40 kg/m   Vitals:   05/09/23 1538  BP: (!) 150/110  Pulse: 75  Height: 6\' 5"  (1.956 m)  Weight: (!) 425 lb (192.8 kg)  SpO2: 94%  BMI (Calculated): 50.39    Physical Exam Vitals reviewed.  Constitutional:      Appearance: Normal appearance. He is morbidly obese.  HENT:     Head: Normocephalic.     Left Ear: There is no impacted cerumen.     Nose: Nose normal.     Mouth/Throat:     Mouth: Mucous membranes are moist.     Pharynx: No posterior oropharyngeal erythema.  Eyes:     Extraocular Movements: Extraocular movements intact.     Pupils: Pupils are equal, round, and reactive to light.  Cardiovascular:     Rate and Rhythm: Regular rhythm.     Chest Wall: PMI is not displaced.     Pulses: Normal pulses.     Heart sounds: Normal heart sounds. No murmur heard. Pulmonary:     Effort: Pulmonary effort is normal.     Breath sounds: Normal air entry. No rhonchi or rales.  Abdominal:     General: Abdomen is flat. Bowel sounds are  normal. There is no distension.     Palpations: Abdomen is soft. There is no hepatomegaly, splenomegaly or mass.     Tenderness: There is no abdominal tenderness.  Musculoskeletal:        General: Normal range of motion.     Cervical back: Normal range of motion and neck supple.     Right lower leg: No edema.     Left lower leg: No edema.  Skin:    General: Skin is warm and dry.  Neurological:     General: No focal deficit present.     Mental Status: He is alert and oriented to person, place, and time.     Cranial Nerves: No cranial nerve deficit.     Motor: No weakness.  Psychiatric:        Mood and Affect: Mood normal.        Behavior: Behavior normal.      No results found for any visits on 05/09/23.  No results found for this or any previous visit (from the past 2160 hour(Annalisse Minkoff)).    Assessment & Plan:  As per problem list. Problem List Items Addressed This Visit       Cardiovascular and  Mediastinum   Severe hypertension   Relevant Medications   olmesartan (BENICAR) 40 MG tablet   amLODipine (NORVASC) 10 MG tablet     Endocrine   Hypogonadism in male   Relevant Orders   Ambulatory referral to Urology     Other   Morbid obesity with BMI of 45.0-49.9, adult (HCC) - Primary    Return in about 6 weeks (around 06/20/2023) for BP followup, Weight follow up.   Total time spent: 20 minutes  Luna Fuse, MD  05/09/2023   This document may have been prepared by [facility] Voice Recognition software and as such may include unintentional dictation errors.

## 2023-05-10 ENCOUNTER — Ambulatory Visit: Payer: 59 | Admitting: Internal Medicine

## 2023-05-10 MED ORDER — SILDENAFIL CITRATE 100 MG PO TABS: 100.0000 mg | ORAL_TABLET | ORAL | 1 refills | Status: DC | PRN

## 2023-06-07 ENCOUNTER — Ambulatory Visit: Payer: Self-pay | Admitting: Urology

## 2023-06-08 ENCOUNTER — Ambulatory Visit: Payer: Self-pay | Admitting: Urology

## 2023-06-21 ENCOUNTER — Encounter: Payer: Self-pay | Admitting: Internal Medicine

## 2023-06-21 ENCOUNTER — Ambulatory Visit: Payer: 59 | Admitting: Internal Medicine

## 2023-06-21 VITALS — BP 160/95 | HR 92 | Ht 77.0 in | Wt >= 6400 oz

## 2023-06-21 DIAGNOSIS — I10 Essential (primary) hypertension: Secondary | ICD-10-CM | POA: Diagnosis not present

## 2023-06-21 DIAGNOSIS — Z6841 Body Mass Index (BMI) 40.0 and over, adult: Secondary | ICD-10-CM

## 2023-06-21 DIAGNOSIS — N528 Other male erectile dysfunction: Secondary | ICD-10-CM

## 2023-06-21 MED ORDER — TADALAFIL 20 MG PO TABS: 20.0000 mg | ORAL_TABLET | Freq: Every day | ORAL | 2 refills | Status: DC | PRN

## 2023-06-21 MED ORDER — CARVEDILOL 12.5 MG PO TABS: 12.5000 mg | ORAL_TABLET | Freq: Two times a day (BID) | ORAL | 1 refills | Status: DC

## 2023-06-21 MED ORDER — AMLODIPINE-OLMESARTAN 10-40 MG PO TABS: 1.0000 | ORAL_TABLET | Freq: Every day | ORAL | 1 refills | Status: DC

## 2023-06-21 NOTE — Progress Notes (Signed)
Established Patient Office Visit  Subjective:  Patient ID: Todd [name], male    DOB: Mar 01, 1981  Age: 42 y.o. MRN: [id number]  Chief Complaint  Patient presents with   Follow-up    6 week B/P & weight check    No new complaints, here for lab review and medication refills. Started GLP-1 with 11 lb weight loss but bp still elevated though.  No other concerns at this time.   Past Medical History:  Diagnosis Date   Arthritis     Past Surgical History:  Procedure Laterality Date   PATELLAR TENDON REPAIR Right 12/16/2021   Procedure: Right patellar tendon repair;  Surgeon: Signa Kell, MD;  Location: [facility] ORS;  Service: Orthopedics;  Laterality: Right;    Social History   Socioeconomic History   Marital status: Significant Other    Spouse name: Not on file   Number of children: 3   Years of education: Not on file   Highest education level: Not on file  Occupational History   Not on file  Tobacco Use   Smoking status: Never   Smokeless tobacco: Not on file  Substance and Sexual Activity   Alcohol use: Not Currently   Drug use: Never   Sexual activity: Not on file  Other Topics Concern   Not on file  Social History Narrative   Not on file   Social Determinants of Health   Financial Resource Strain: Not on file  Food Insecurity: Not on file  Transportation Needs: Not on file  Physical Activity: Not on file  Stress: Not on file  Social Connections: Not on file  Intimate Partner Violence: Not on file    No family history on file.  No Known Allergies  Review of Systems  Constitutional: Negative.   HENT: Negative.    Eyes: Negative.   Respiratory: Negative.    Cardiovascular: Negative.   Gastrointestinal: Negative.   Genitourinary: Negative.   Musculoskeletal:  Positive for back pain.  Skin: Negative.   Neurological: Negative.  Negative for dizziness and headaches.  Endo/Heme/Allergies: Negative.        Objective:   BP (!) 160/95   Pulse 92    Ht 6\' 5"  (1.956 m)   Wt (!) 414 lb (187.8 kg)   SpO2 98%   BMI 49.09 kg/m   Vitals:   06/21/23 1026  BP: (!) 160/95  Pulse: 92  Height: 6\' 5"  (1.956 m)  Weight: (!) 414 lb (187.8 kg)  SpO2: 98%  BMI (Calculated): 49.08    Physical Exam Vitals reviewed.  Constitutional:      Appearance: Normal appearance. He is morbidly obese.  HENT:     Head: Normocephalic.     Left Ear: There is no impacted cerumen.     Nose: Nose normal.     Mouth/Throat:     Mouth: Mucous membranes are moist.     Pharynx: No posterior oropharyngeal erythema.  Eyes:     Extraocular Movements: Extraocular movements intact.     Pupils: Pupils are equal, round, and reactive to light.  Cardiovascular:     Rate and Rhythm: Regular rhythm.     Chest Wall: PMI is not displaced.     Pulses: Normal pulses.     Heart sounds: Normal heart sounds. No murmur heard. Pulmonary:     Effort: Pulmonary effort is normal.     Breath sounds: Normal air entry. No rhonchi or rales.  Abdominal:     General: Abdomen is flat. Bowel sounds are  normal. There is no distension.     Palpations: Abdomen is soft. There is no hepatomegaly, splenomegaly or mass.     Tenderness: There is no abdominal tenderness.  Musculoskeletal:        General: Normal range of motion.     Cervical back: Normal range of motion and neck supple.     Right lower leg: No edema.     Left lower leg: No edema.  Skin:    General: Skin is warm and dry.  Neurological:     General: No focal deficit present.     Mental Status: He is alert and oriented to person, place, and time.     Cranial Nerves: No cranial nerve deficit.     Motor: No weakness.  Psychiatric:        Mood and Affect: Mood normal.        Behavior: Behavior normal.      No results found for any visits on 06/21/23.  No results found for this or any previous visit (from the past 2160 hour(Khadeeja Elden)).    Assessment & Plan:   Problem List Items Addressed This Visit   None   No  follow-ups on file.   Total time spent: 20 minutes  Luna Fuse, MD  06/21/2023   This document may have been prepared by [facility] Voice Recognition software and as such may include unintentional dictation errors.

## 2023-07-03 LAB — TESTOSTERONE, FREE AND TOTAL (INCLUDES SHBG)-(MALES)
% Free Testosterone: 1.6 %
Free Testosterone, S: 54 pg/mL
Sex Hormone Binding Globulin: 42.3 nmol/L
Testosterone, Serum (Total): 335 ng/dL

## 2023-07-11 ENCOUNTER — Other Ambulatory Visit: Payer: Self-pay | Admitting: Internal Medicine

## 2023-07-19 ENCOUNTER — Ambulatory Visit: Payer: 59 | Admitting: Internal Medicine

## 2023-07-19 ENCOUNTER — Encounter: Payer: Self-pay | Admitting: Internal Medicine

## 2023-07-19 ENCOUNTER — Ambulatory Visit: Payer: Self-pay | Admitting: Urology

## 2023-07-19 VITALS — BP 130/90 | HR 87 | Ht 77.0 in | Wt >= 6400 oz

## 2023-07-19 DIAGNOSIS — I10 Essential (primary) hypertension: Secondary | ICD-10-CM

## 2023-07-19 DIAGNOSIS — Z6841 Body Mass Index (BMI) 40.0 and over, adult: Secondary | ICD-10-CM

## 2023-07-19 NOTE — Progress Notes (Signed)
Established Patient Office Visit  Subjective:  Patient ID: Todd [name], male    DOB: 1981-04-27  Age: 42 y.o. MRN: [id number]  Chief Complaint  Patient presents with   Follow-up    1 mo BP f/u    No new complaints, BP still not fully controlled but admits to poor compliance with evening dose of Carvedilol. Second testosterone levels normal.     No other concerns at this time.   Past Medical History:  Diagnosis Date   Arthritis     Past Surgical History:  Procedure Laterality Date   PATELLAR TENDON REPAIR Right 12/16/2021   Procedure: Right patellar tendon repair;  Surgeon: Signa Kell, MD;  Location: [facility] ORS;  Service: Orthopedics;  Laterality: Right;    Social History   Socioeconomic History   Marital status: Significant Other    Spouse name: Not on file   Number of children: 3   Years of education: Not on file   Highest education level: Not on file  Occupational History   Not on file  Tobacco Use   Smoking status: Never   Smokeless tobacco: Not on file  Substance and Sexual Activity   Alcohol use: Not Currently   Drug use: Never   Sexual activity: Not on file  Other Topics Concern   Not on file  Social History Narrative   Not on file   Social Determinants of Health   Financial Resource Strain: Not on file  Food Insecurity: Not on file  Transportation Needs: Not on file  Physical Activity: Not on file  Stress: Not on file  Social Connections: Not on file  Intimate Partner Violence: Not on file    No family history on file.  No Known Allergies  Review of Systems  Constitutional: Negative.  Negative for weight loss.  HENT: Negative.    Eyes: Negative.   Respiratory: Negative.    Cardiovascular: Negative.   Gastrointestinal: Negative.   Genitourinary: Negative.   Musculoskeletal:  Positive for back pain.  Skin: Negative.   Neurological: Negative.  Negative for dizziness and headaches.  Endo/Heme/Allergies: Negative.         Objective:   BP (!) 130/90   Pulse 87   Ht 6\' 5"  (1.956 m)   Wt (!) 417 lb 9.6 oz (189.4 kg)   SpO2 98%   BMI 49.52 kg/m   Vitals:   07/19/23 1114  BP: (!) 130/90  Pulse: 87  Height: 6\' 5"  (1.956 m)  Weight: (!) 417 lb 9.6 oz (189.4 kg)  SpO2: 98%  BMI (Calculated): 49.51    Physical Exam Vitals reviewed.  Constitutional:      Appearance: Normal appearance. He is morbidly obese.  HENT:     Head: Normocephalic.     Left Ear: There is no impacted cerumen.     Nose: Nose normal.     Mouth/Throat:     Mouth: Mucous membranes are moist.     Pharynx: No posterior oropharyngeal erythema.  Eyes:     Extraocular Movements: Extraocular movements intact.     Pupils: Pupils are equal, round, and reactive to light.  Cardiovascular:     Rate and Rhythm: Regular rhythm.     Chest Wall: PMI is not displaced.     Pulses: Normal pulses.     Heart sounds: Normal heart sounds. No murmur heard. Pulmonary:     Effort: Pulmonary effort is normal.     Breath sounds: Normal air entry. No rhonchi or rales.  Abdominal:  General: Abdomen is flat. Bowel sounds are normal. There is no distension.     Palpations: Abdomen is soft. There is no hepatomegaly, splenomegaly or mass.     Tenderness: There is no abdominal tenderness.  Musculoskeletal:        General: Normal range of motion.     Cervical back: Normal range of motion and neck supple.     Right lower leg: No edema.     Left lower leg: No edema.  Skin:    General: Skin is warm and dry.  Neurological:     General: No focal deficit present.     Mental Status: He is alert and oriented to person, place, and time.     Cranial Nerves: No cranial nerve deficit.     Motor: No weakness.  Psychiatric:        Mood and Affect: Mood normal.        Behavior: Behavior normal.      No results found for any visits on 07/19/23.  Recent Results (from the past 2160 hour(Sherril Shipman))  Testosterone Free with SHBG     Status: None   Collection  Time: 06/21/23 10:57 AM  Result Value Ref Range   Testosterone, Serum (Total) 335 ng/dL    Comment: This test was developed and its performance characteristics determined by [facility]. It has not been cleared or approved by the Food and Drug Administration. Reference Range: Adult Males >18 years    11 - 916 This [facility] LC/MS-MS method is currently certified by the [facility] Hormone Standardization Program (HoST).  Adult male reference interval is based on a population of healthy nonobese males (BMI <30) between 90 and 10 years old. Mardee Postin 4742,595;6387-5643 PMID: [id number].    % Free Testosterone 1.6 %    Comment: This test was developed and its performance characteristics determined by [facility]. It has not been cleared or approved by the Food and Drug Administration. Reference Range: Adult Males: 1.5 - 3.2    Free Testosterone, Khalil Szczepanik 54 pg/mL    Comment: Reference Range: Adult Males: 51 - 280    Sex Hormone Binding Globulin 42.3 nmol/L    Comment: Reference Range: Pubertal: 16.0 - 100.0 20 - 49y: 16.5 - 55.9 >49y:     19.3 - 76.4       Assessment & Plan:  As per problem list.  Problem List Items Addressed This Visit       Cardiovascular and Mediastinum   Primary hypertension     Other   Morbid obesity with BMI of 45.0-49.9, adult (HCC) - Primary    Return in about 4 weeks (around 08/16/2023) for BP followup, Weight management.   Total time spent: 20 minutes  Luna Fuse, MD  07/19/2023   This document may have been prepared by [facility] Voice Recognition software and as such may include unintentional dictation errors.

## 2023-08-16 ENCOUNTER — Ambulatory Visit: Payer: 59 | Admitting: Internal Medicine

## 2023-08-16 ENCOUNTER — Encounter: Payer: Self-pay | Admitting: Internal Medicine

## 2023-08-16 VITALS — BP 138/90 | HR 88 | Ht 77.0 in | Wt >= 6400 oz

## 2023-08-16 DIAGNOSIS — Z6841 Body Mass Index (BMI) 40.0 and over, adult: Secondary | ICD-10-CM | POA: Diagnosis not present

## 2023-08-16 DIAGNOSIS — I10 Essential (primary) hypertension: Secondary | ICD-10-CM | POA: Insufficient documentation

## 2023-08-16 MED ORDER — CARVEDILOL 25 MG PO TABS: 25.0000 mg | ORAL_TABLET | Freq: Two times a day (BID) | ORAL | 1 refills | Status: DC

## 2023-08-16 NOTE — Progress Notes (Signed)
Established Patient Office Visit  Subjective:  Patient ID: Todd [name], male    DOB: 06/27/1981  Age: 42 y.o. MRN: [id number]  Chief Complaint  Patient presents with   Follow-up    1 month BP check weight management.    No new complaints, here for BP follow up and weight management. BP improved but still elevated on recheck and admits to lack of exercise.    No other concerns at this time.   Past Medical History:  Diagnosis Date   Arthritis     Past Surgical History:  Procedure Laterality Date   PATELLAR TENDON REPAIR Right 12/16/2021   Procedure: Right patellar tendon repair;  Surgeon: Signa Kell, MD;  Location: [facility] ORS;  Service: Orthopedics;  Laterality: Right;    Social History   Socioeconomic History   Marital status: Significant Other    Spouse name: Not on file   Number of children: 3   Years of education: Not on file   Highest education level: Not on file  Occupational History   Not on file  Tobacco Use   Smoking status: Never   Smokeless tobacco: Not on file  Substance and Sexual Activity   Alcohol use: Not Currently   Drug use: Never   Sexual activity: Not on file  Other Topics Concern   Not on file  Social History Narrative   Not on file   Social Determinants of Health   Financial Resource Strain: Not on file  Food Insecurity: Not on file  Transportation Needs: Not on file  Physical Activity: Not on file  Stress: Not on file  Social Connections: Not on file  Intimate Partner Violence: Not on file    No family history on file.  No Known Allergies  Outpatient Medications Prior to Visit  Medication Sig   amLODipine-olmesartan (AZOR) 10-40 MG tablet Take 1 tablet by mouth daily.   tadalafil (CIALIS) 20 MG tablet Take 1 tablet (20 mg total) by mouth daily as needed for erectile dysfunction.   tirzepatide (ZEPBOUND) 5 MG/0.5ML Pen INJECT THE CONTENTS OF ONE PEN  SUBCUTANEOUSLY WEEKLY AS  DIRECTED . START AFTER  COMPLETING 2.5 MG DOSE.    [DISCONTINUED] carvedilol (COREG) 12.5 MG tablet Take 1 tablet (12.5 mg total) by mouth 2 (two) times daily.   No facility-administered medications prior to visit.    Review of Systems  Neurological:  Negative for dizziness and headaches.  All other systems reviewed and are negative.      Objective:   BP (!) 138/90   Pulse 88   Ht 6\' 5"  (1.956 m)   Wt (!) 415 lb 3.2 oz (188.3 kg)   SpO2 94%   BMI 49.24 kg/m   Vitals:   08/16/23 1009 08/16/23 1037  BP: (!) 150/100 (!) 138/90  Pulse: 88   Height: 6\' 5"  (1.956 m)   Weight: (!) 415 lb 3.2 oz (188.3 kg)   SpO2: 94%   BMI (Calculated): 49.23     Physical Exam Vitals reviewed.  Constitutional:      Appearance: Normal appearance. He is obese.  HENT:     Head: Normocephalic.     Left Ear: There is no impacted cerumen.     Nose: Nose normal.     Mouth/Throat:     Mouth: Mucous membranes are moist.     Pharynx: No posterior oropharyngeal erythema.  Eyes:     Extraocular Movements: Extraocular movements intact.     Pupils: Pupils are equal, round, and reactive to  light.  Cardiovascular:     Rate and Rhythm: Regular rhythm.     Chest Wall: PMI is not displaced.     Pulses: Normal pulses.     Heart sounds: Normal heart sounds. No murmur heard. Pulmonary:     Effort: Pulmonary effort is normal.     Breath sounds: Normal air entry. No rhonchi or rales.  Abdominal:     Todd: Abdomen is flat. Bowel sounds are normal. There is no distension.     Palpations: Abdomen is soft. There is no hepatomegaly, splenomegaly or mass.     Tenderness: There is no abdominal tenderness.  Musculoskeletal:        Todd: Normal range of motion.     Cervical back: Normal range of motion and neck supple.     Right lower leg: No edema.     Left lower leg: No edema.  Skin:    Todd: Skin is warm and dry.  Neurological:     Todd: No focal deficit present.     Mental Status: He is alert and oriented to person, place, and time.      Cranial Nerves: No cranial nerve deficit.     Motor: No weakness.  Psychiatric:        Mood and Affect: Mood normal.        Behavior: Behavior normal.      No results found for any visits on 08/16/23.      Assessment & Plan:  As per problem list. Increase betablocker dosage and increase exercise. Problem List Items Addressed This Visit       Cardiovascular and Mediastinum   Severe hypertension   Relevant Medications   carvedilol (COREG) 25 MG tablet     Other   Morbid obesity with BMI of 45.0-49.9, adult (HCC) - Primary    Return in about 2 months (around 10/16/2023) for BP followup, Weight management.   Total time spent: 20 minutes  Luna Fuse, MD  08/16/2023   This document may have been prepared by [facility] Voice Recognition software and as such may include unintentional dictation errors.

## 2023-10-17 ENCOUNTER — Ambulatory Visit: Payer: 59 | Admitting: Internal Medicine

## 2023-10-17 VITALS — BP 127/76 | HR 67 | Temp 98.7°F | Ht 77.0 in | Wt 397.2 lb

## 2023-10-17 DIAGNOSIS — I10 Essential (primary) hypertension: Secondary | ICD-10-CM | POA: Diagnosis not present

## 2023-10-17 DIAGNOSIS — Z6841 Body Mass Index (BMI) 40.0 and over, adult: Secondary | ICD-10-CM

## 2023-10-17 DIAGNOSIS — N528 Other male erectile dysfunction: Secondary | ICD-10-CM | POA: Diagnosis not present

## 2023-10-17 DIAGNOSIS — E291 Testicular hypofunction: Secondary | ICD-10-CM | POA: Diagnosis not present

## 2023-10-17 MED ORDER — ZEPBOUND 10 MG/0.5ML ~~LOC~~ SOAJ: 10.0000 mg | SUBCUTANEOUS | 0 refills | Status: DC

## 2023-10-17 MED ORDER — TADALAFIL 20 MG PO TABS: 20.0000 mg | ORAL_TABLET | Freq: Every day | ORAL | 2 refills | Status: DC | PRN

## 2023-10-17 NOTE — Progress Notes (Signed)
Established Patient Office Visit  Subjective:  Patient ID: Todd [name], male    DOB: 11/02/1980  Age: 43 y.o. MRN: [id number]  Chief Complaint  Patient presents with   Follow-up    2 month BP and weight check    No new complaints, here for lab review and medication refills. Still on 5 mg Zepbound due to insurance delay but has been exercising heavily with 18 lbs weight loss.   No other concerns at this time.   Past Medical History:  Diagnosis Date   Arthritis     Past Surgical History:  Procedure Laterality Date   PATELLAR TENDON REPAIR Right 12/16/2021   Procedure: Right patellar tendon repair;  Surgeon: Signa Kell, MD;  Location: [facility] ORS;  Service: Orthopedics;  Laterality: Right;    Social History   Socioeconomic History   Marital status: Significant Other    Spouse name: Not on file   Number of children: 3   Years of education: Not on file   Highest education level: Not on file  Occupational History   Not on file  Tobacco Use   Smoking status: Never   Smokeless tobacco: Not on file  Substance and Sexual Activity   Alcohol use: Not Currently   Drug use: Never   Sexual activity: Not on file  Other Topics Concern   Not on file  Social History Narrative   Not on file   Social Drivers of Health   Financial Resource Strain: Not on file  Food Insecurity: Not on file  Transportation Needs: Not on file  Physical Activity: Not on file  Stress: Not on file  Social Connections: Not on file  Intimate Partner Violence: Not on file    No family history on file.  No Known Allergies  Outpatient Medications Prior to Visit  Medication Sig   amLODipine-olmesartan (AZOR) 10-40 MG tablet Take 1 tablet by mouth daily.   carvedilol (COREG) 25 MG tablet Take 1 tablet (25 mg total) by mouth 2 (two) times daily.   [DISCONTINUED] tadalafil (CIALIS) 20 MG tablet Take 1 tablet (20 mg total) by mouth daily as needed for erectile dysfunction.   [DISCONTINUED]  tirzepatide (ZEPBOUND) 5 MG/0.5ML Pen INJECT THE CONTENTS OF ONE PEN  SUBCUTANEOUSLY WEEKLY AS  DIRECTED . START AFTER  COMPLETING 2.5 MG DOSE.   No facility-administered medications prior to visit.    Review of Systems  Constitutional:  Positive for weight loss (18 lbs).  HENT: Negative.    Eyes: Negative.   Respiratory: Negative.    Cardiovascular: Negative.   Gastrointestinal: Negative.   Genitourinary: Negative.   Musculoskeletal:  Positive for back pain.  Skin: Negative.   Neurological: Negative.  Negative for dizziness and headaches.  Endo/Heme/Allergies: Negative.        Objective:   BP 127/76   Pulse 67   Temp 98.7 F (37.1 C)   Ht 6\' 5"  (1.956 m)   Wt (!) 397 lb 3.2 oz (180.2 kg)   SpO2 98%   BMI 47.10 kg/m   Vitals:   10/17/23 1033  BP: 127/76  Pulse: 67  Temp: 98.7 F (37.1 C)  Height: 6\' 5"  (1.956 m)  Weight: (!) 397 lb 3.2 oz (180.2 kg)  SpO2: 98%  BMI (Calculated): 47.09    Physical Exam Vitals reviewed.  Constitutional:      Appearance: Normal appearance. He is obese.  HENT:     Head: Normocephalic.     Left Ear: There is no impacted cerumen.  Nose: Nose normal.     Mouth/Throat:     Mouth: Mucous membranes are moist.     Pharynx: No posterior oropharyngeal erythema.  Eyes:     Extraocular Movements: Extraocular movements intact.     Pupils: Pupils are equal, round, and reactive to light.  Cardiovascular:     Rate and Rhythm: Regular rhythm.     Chest Wall: PMI is not displaced.     Pulses: Normal pulses.     Heart sounds: Normal heart sounds. No murmur heard. Pulmonary:     Effort: Pulmonary effort is normal.     Breath sounds: Normal air entry. No rhonchi or rales.  Abdominal:     General: Abdomen is flat. Bowel sounds are normal. There is no distension.     Palpations: Abdomen is soft. There is no hepatomegaly, splenomegaly or mass.     Tenderness: There is no abdominal tenderness.  Musculoskeletal:        General: Normal  range of motion.     Cervical back: Normal range of motion and neck supple.     Right lower leg: No edema.     Left lower leg: No edema.  Skin:    General: Skin is warm and dry.  Neurological:     General: No focal deficit present.     Mental Status: He is alert and oriented to person, place, and time.     Cranial Nerves: No cranial nerve deficit.     Motor: No weakness.  Psychiatric:        Mood and Affect: Mood normal.        Behavior: Behavior normal.      No results found for any visits on 10/17/23.  No results found for this or any previous visit (from the past 2160 hours).    Assessment & Plan:  As per problem list. Problem List Items Addressed This Visit       Cardiovascular and Mediastinum   Primary hypertension   Relevant Medications   tadalafil (CIALIS) 20 MG tablet     Endocrine   Hypogonadism in male     Other   Morbid obesity with BMI of 45.0-49.9, adult (HCC) - Primary   Relevant Medications   tirzepatide (ZEPBOUND) 10 MG/0.5ML Pen   Other male erectile dysfunction   Relevant Medications   tadalafil (CIALIS) 20 MG tablet    Return in about 2 months (around 12/15/2023) for Weight management.   Total time spent: 20 minutes  Luna Fuse, MD  10/17/2023   This document may have been prepared by [facility] Voice Recognition software and as such may include unintentional dictation errors.

## 2023-10-18 ENCOUNTER — Ambulatory Visit: Payer: 59 | Admitting: Internal Medicine

## 2023-10-26 ENCOUNTER — Telehealth: Payer: Self-pay | Admitting: Internal Medicine

## 2023-10-26 NOTE — Telephone Encounter (Signed)
Called pt to r/s appt, he notified me that his ZEPBOUND needed a PA   Please advise

## 2023-12-20 ENCOUNTER — Ambulatory Visit: Payer: 59 | Admitting: Internal Medicine

## 2024-01-08 ENCOUNTER — Other Ambulatory Visit: Payer: Self-pay | Admitting: Internal Medicine

## 2024-01-08 DIAGNOSIS — I10 Essential (primary) hypertension: Secondary | ICD-10-CM

## 2024-01-10 ENCOUNTER — Ambulatory Visit: Payer: 59 | Admitting: Internal Medicine

## 2024-01-13 ENCOUNTER — Other Ambulatory Visit: Payer: Self-pay | Admitting: Internal Medicine

## 2024-02-03 ENCOUNTER — Other Ambulatory Visit: Payer: Self-pay | Admitting: Internal Medicine

## 2024-02-03 DIAGNOSIS — I10 Essential (primary) hypertension: Secondary | ICD-10-CM

## 2024-02-07 ENCOUNTER — Encounter: Payer: Self-pay | Admitting: Internal Medicine

## 2024-02-07 ENCOUNTER — Ambulatory Visit: Admitting: Internal Medicine

## 2024-02-07 VITALS — BP 130/80 | HR 89 | Temp 97.4°F | Ht 77.0 in | Wt 386.4 lb

## 2024-02-07 DIAGNOSIS — N528 Other male erectile dysfunction: Secondary | ICD-10-CM

## 2024-02-07 DIAGNOSIS — Z6841 Body Mass Index (BMI) 40.0 and over, adult: Secondary | ICD-10-CM

## 2024-02-07 DIAGNOSIS — I10 Essential (primary) hypertension: Secondary | ICD-10-CM | POA: Diagnosis not present

## 2024-02-07 MED ORDER — ZEPBOUND 10 MG/0.5ML ~~LOC~~ SOAJ: 10.0000 mg | SUBCUTANEOUS | 0 refills | Status: DC

## 2024-02-07 MED ORDER — CARVEDILOL 25 MG PO TABS: 25.0000 mg | ORAL_TABLET | Freq: Two times a day (BID) | ORAL | 1 refills | Status: DC

## 2024-02-07 MED ORDER — TADALAFIL 20 MG PO TABS: 20.0000 mg | ORAL_TABLET | Freq: Every day | ORAL | 2 refills | Status: DC | PRN

## 2024-02-07 MED ORDER — AMLODIPINE-OLMESARTAN 10-40 MG PO TABS: 1.0000 | ORAL_TABLET | Freq: Every day | ORAL | 1 refills | Status: DC

## 2024-02-07 NOTE — Progress Notes (Signed)
 Established Patient Office Visit  Subjective:  Patient ID: Todd [name], male    DOB: 17-Nov-1980  Age: 43 y.o. MRN: [id number]  Chief Complaint  Patient presents with   Follow-up    2 month follow up weight management    No new complaints, here for  weight management  and medication refills. BP remains well controlled, lost weight with glp-1 and exercise.     No other concerns at this time.   Past Medical History:  Diagnosis Date   Arthritis     Past Surgical History:  Procedure Laterality Date   PATELLAR TENDON REPAIR Right 12/16/2021   Procedure: Right patellar tendon repair;  Surgeon: Lorri Rota, MD;  Location: [facility] ORS;  Service: Orthopedics;  Laterality: Right;    Social History   Socioeconomic History   Marital status: Significant Other    Spouse name: Not on file   Number of children: 3   Years of education: Not on file   Highest education level: Not on file  Occupational History   Not on file  Tobacco Use   Smoking status: Never   Smokeless tobacco: Not on file  Substance and Sexual Activity   Alcohol use: Not Currently   Drug use: Never   Sexual activity: Not on file  Other Topics Concern   Not on file  Social History Narrative   Not on file   Social Drivers of Health   Financial Resource Strain: Not on file  Food Insecurity: Not on file  Transportation Needs: Not on file  Physical Activity: Not on file  Stress: Not on file  Social Connections: Not on file  Intimate Partner Violence: Not on file    No family history on file.  No Known Allergies  Outpatient Medications Prior to Visit  Medication Sig   [DISCONTINUED] amLODipine -olmesartan  (AZOR ) 10-40 MG tablet Take 1 tablet by mouth once daily   [DISCONTINUED] carvedilol  (COREG ) 25 MG tablet Take 1 tablet (25 mg total) by mouth 2 (two) times daily.   [DISCONTINUED] tadalafil  (CIALIS ) 20 MG tablet Take 1 tablet (20 mg total) by mouth daily as needed for erectile dysfunction.    [DISCONTINUED] tirzepatide  (ZEPBOUND ) 7.5 MG/0.5ML Pen INJECT THE CONTENTS OF ONE PEN  SUBCUTANEOUSLY WEEKLY FOR 4  WEEKS   No facility-administered medications prior to visit.    Review of Systems  Constitutional:  Positive for weight loss (11 lbs).  HENT: Negative.    Eyes: Negative.   Respiratory: Negative.    Cardiovascular: Negative.   Gastrointestinal: Negative.   Genitourinary: Negative.   Skin: Negative.   Neurological: Negative.  Negative for dizziness and headaches.  Endo/Heme/Allergies: Negative.        Objective:   BP 130/80   Pulse 89   Temp (!) 97.4 F (36.3 C)   Ht 6\' 5"  (1.956 m)   Wt (!) 386 lb 6.4 oz (175.3 kg)   SpO2 95%   BMI 45.82 kg/m   Vitals:   02/07/24 0857  BP: 130/80  Pulse: 89  Temp: (!) 97.4 F (36.3 C)  Height: 6\' 5"  (1.956 m)  Weight: (!) 386 lb 6.4 oz (175.3 kg)  SpO2: 95%  BMI (Calculated): 45.81    Physical Exam Vitals reviewed.  Constitutional:      Appearance: Normal appearance. He is obese.  HENT:     Head: Normocephalic.     Left Ear: There is no impacted cerumen.     Nose: Nose normal.     Mouth/Throat:  Mouth: Mucous membranes are moist.     Pharynx: No posterior oropharyngeal erythema.  Eyes:     Extraocular Movements: Extraocular movements intact.     Pupils: Pupils are equal, round, and reactive to light.  Cardiovascular:     Rate and Rhythm: Regular rhythm.     Chest Wall: PMI is not displaced.     Pulses: Normal pulses.     Heart sounds: Normal heart sounds. No murmur heard. Pulmonary:     Effort: Pulmonary effort is normal.     Breath sounds: Normal air entry. No rhonchi or rales.  Abdominal:     General: Abdomen is flat. Bowel sounds are normal. There is no distension.     Palpations: Abdomen is soft. There is no hepatomegaly, splenomegaly or mass.     Tenderness: There is no abdominal tenderness.  Musculoskeletal:        General: Normal range of motion.     Cervical back: Normal range of motion  and neck supple.     Right lower leg: No edema.     Left lower leg: No edema.  Skin:    General: Skin is warm and dry.  Neurological:     General: No focal deficit present.     Mental Status: He is alert and oriented to person, place, and time.     Cranial Nerves: No cranial nerve deficit.     Motor: No weakness.  Psychiatric:        Mood and Affect: Mood normal.        Behavior: Behavior normal.      No results found for any visits on 02/07/24.  No results found for this or any previous visit (from the past 2160 hours).    Assessment & Plan:  As per problem list  Problem List Items Addressed This Visit       Cardiovascular and Mediastinum   Primary hypertension   Relevant Medications   carvedilol  (COREG ) 25 MG tablet   amLODipine -olmesartan  (AZOR ) 10-40 MG tablet   tadalafil  (CIALIS ) 20 MG tablet   Severe hypertension   Relevant Medications   carvedilol  (COREG ) 25 MG tablet   amLODipine -olmesartan  (AZOR ) 10-40 MG tablet   tadalafil  (CIALIS ) 20 MG tablet     Other   Morbid obesity with BMI of 45.0-49.9, adult (HCC) - Primary   Relevant Medications   tirzepatide  (ZEPBOUND ) 10 MG/0.5ML Pen   Other male erectile dysfunction   Relevant Medications   tadalafil  (CIALIS ) 20 MG tablet    Return in about 2 months (around 04/08/2024) for Weight management, BP followup.   Total time spent: 20 minutes  Arzella Bitters, MD  02/07/2024   This document may have been prepared by [facility] Voice Recognition software and as such may include unintentional dictation errors.

## 2024-03-03 ENCOUNTER — Other Ambulatory Visit: Payer: Self-pay | Admitting: Internal Medicine

## 2024-03-15 ENCOUNTER — Other Ambulatory Visit: Payer: Self-pay | Admitting: Internal Medicine

## 2024-03-15 DIAGNOSIS — I10 Essential (primary) hypertension: Secondary | ICD-10-CM

## 2024-04-02 ENCOUNTER — Other Ambulatory Visit: Payer: Self-pay | Admitting: Internal Medicine

## 2024-04-08 ENCOUNTER — Telehealth: Payer: Self-pay | Admitting: Internal Medicine

## 2024-04-08 ENCOUNTER — Other Ambulatory Visit: Payer: Self-pay | Admitting: Internal Medicine

## 2024-04-08 NOTE — Telephone Encounter (Signed)
 PT needs a refill on his ZEPBOUND  10MG  sent to, per pt whichever can get the script in faster, because pt states he has been out for 3 weeks  Either [facility] [address] [facility] or [facility]  Thankyou!

## 2024-04-10 NOTE — Telephone Encounter (Signed)
Pt phone number is no longer in service.

## 2024-04-22 ENCOUNTER — Other Ambulatory Visit: Payer: Self-pay | Admitting: Internal Medicine

## 2024-04-22 DIAGNOSIS — Z6841 Body Mass Index (BMI) 40.0 and over, adult: Secondary | ICD-10-CM

## 2024-04-23 ENCOUNTER — Telehealth: Payer: Self-pay | Admitting: Internal Medicine

## 2024-04-23 NOTE — Telephone Encounter (Signed)
 Pt called regarding his ZEPBOUND , states he has been out for a month now & everytime he tries to get meds it needs a PA  Please advise

## 2024-04-25 ENCOUNTER — Other Ambulatory Visit: Payer: Self-pay

## 2024-04-25 NOTE — Telephone Encounter (Signed)
 Pt's mom called requesting refill on pt's rx zepbound , please advise  I did inform that he needed to make an appt- said they will call back today to schedule

## 2024-04-26 MED ORDER — ZEPBOUND 10 MG/0.5ML ~~LOC~~ SOAJ: 10.0000 mg | SUBCUTANEOUS | 0 refills | Status: DC

## 2024-05-01 ENCOUNTER — Ambulatory Visit: Admitting: Internal Medicine

## 2024-05-01 ENCOUNTER — Encounter: Payer: Self-pay | Admitting: Internal Medicine

## 2024-05-01 VITALS — BP 130/80 | HR 85 | Ht 77.0 in | Wt 366.8 lb

## 2024-05-01 DIAGNOSIS — I10 Essential (primary) hypertension: Secondary | ICD-10-CM

## 2024-05-01 DIAGNOSIS — Z6841 Body Mass Index (BMI) 40.0 and over, adult: Secondary | ICD-10-CM | POA: Diagnosis not present

## 2024-05-01 NOTE — Progress Notes (Signed)
 Established Patient Office Visit  Subjective:  Patient ID: Todd [name], male    DOB: 1981-08-23  Age: 43 y.o. MRN: [id number]  Chief Complaint  Patient presents with   Follow-up    Follow up    No new complaints, here for weight management and medication refills. Continues to lose average 6 lbs/mo on GIP and BP remains well controlled.     No other concerns at this time.   Past Medical History:  Diagnosis Date   Arthritis     Past Surgical History:  Procedure Laterality Date   PATELLAR TENDON REPAIR Right 12/16/2021   Procedure: Right patellar tendon repair;  Surgeon: Tobie Priest, MD;  Location: [facility] ORS;  Service: Orthopedics;  Laterality: Right;    Social History   Socioeconomic History   Marital status: Significant Other    Spouse name: Not on file   Number of children: 3   Years of education: Not on file   Highest education level: Not on file  Occupational History   Not on file  Tobacco Use   Smoking status: Never   Smokeless tobacco: Not on file  Substance and Sexual Activity   Alcohol use: Not Currently   Drug use: Never   Sexual activity: Not on file  Other Topics Concern   Not on file  Social History Narrative   Not on file   Social Drivers of Health   Financial Resource Strain: Not on file  Food Insecurity: Not on file  Transportation Needs: Not on file  Physical Activity: Not on file  Stress: Not on file  Social Connections: Not on file  Intimate Partner Violence: Not on file    No family history on file.  No Known Allergies  Outpatient Medications Prior to Visit  Medication Sig   amLODipine -olmesartan  (AZOR ) 10-40 MG tablet Take 1 tablet by mouth daily.   carvedilol  (COREG ) 25 MG tablet TAKE 1 TABLET BY MOUTH TWICE  DAILY   tadalafil  (CIALIS ) 20 MG tablet Take 1 tablet (20 mg total) by mouth daily as needed for erectile dysfunction.   tirzepatide  (ZEPBOUND ) 10 MG/0.5ML Pen Inject 10 mg into the skin once a week.   No  facility-administered medications prior to visit.    Review of Systems  Constitutional:  Positive for weight loss (20 lbs).  HENT: Negative.    Eyes: Negative.   Respiratory: Negative.    Cardiovascular: Negative.   Gastrointestinal: Negative.   Genitourinary: Negative.   Skin: Negative.   Neurological: Negative.  Negative for dizziness and headaches.  Endo/Heme/Allergies: Negative.        Objective:   BP 130/80   Pulse 85   Ht 6' 5 (1.956 m)   Wt (!) 366 lb 12.8 oz (166.4 kg)   SpO2 97%   BMI 43.50 kg/m   Vitals:   05/01/24 0850  BP: 130/80  Pulse: 85  Height: 6' 5 (1.956 m)  Weight: (!) 366 lb 12.8 oz (166.4 kg)  SpO2: 97%  BMI (Calculated): 43.49    Physical Exam Vitals reviewed.  Constitutional:      Appearance: Normal appearance. He is obese.  HENT:     Head: Normocephalic.     Left Ear: There is no impacted cerumen.     Nose: Nose normal.     Mouth/Throat:     Mouth: Mucous membranes are moist.     Pharynx: No posterior oropharyngeal erythema.  Eyes:     Extraocular Movements: Extraocular movements intact.     Pupils: Pupils  are equal, round, and reactive to light.  Cardiovascular:     Rate and Rhythm: Regular rhythm.     Chest Wall: PMI is not displaced.     Pulses: Normal pulses.     Heart sounds: Normal heart sounds. No murmur heard. Pulmonary:     Effort: Pulmonary effort is normal.     Breath sounds: Normal air entry. No rhonchi or rales.  Abdominal:     General: Abdomen is flat. Bowel sounds are normal. There is no distension.     Palpations: Abdomen is soft. There is no hepatomegaly, splenomegaly or mass.     Tenderness: There is no abdominal tenderness.  Musculoskeletal:        General: Normal range of motion.     Cervical back: Normal range of motion and neck supple.     Right lower leg: No edema.     Left lower leg: No edema.  Skin:    General: Skin is warm and dry.  Neurological:     General: No focal deficit present.      Mental Status: He is alert and oriented to person, place, and time.     Cranial Nerves: No cranial nerve deficit.     Motor: No weakness.  Psychiatric:        Mood and Affect: Mood normal.        Behavior: Behavior normal.      No results found for any visits on 05/01/24.  No results found for this or any previous visit (from the past 2160 hours).    Assessment & Plan:  Todd [name] was seen today for follow-up.  Primary hypertension -     Comprehensive metabolic panel with GFR  Morbid obesity with BMI of 40.0-44.9, adult (HCC) -     Lipid panel   Stricter low calorie diet, low cholesterol and low fat diet and exercise as much as possible. Problem List Items Addressed This Visit       Cardiovascular and Mediastinum   Primary hypertension - Primary   Relevant Orders   Comprehensive metabolic panel with GFR     Other   Morbid obesity with BMI of 40.0-44.9, adult (HCC)   Relevant Orders   Lipid panel    Return in about 2 months (around 07/01/2024) for Weight management, fu with labs prior.   Total time spent: 20 minutes  [name], MD  05/01/2024   This document may have been prepared by [facility] Voice Recognition software and as such may include unintentional dictation errors.

## 2024-06-19 ENCOUNTER — Other Ambulatory Visit: Payer: Self-pay | Admitting: Internal Medicine

## 2024-06-19 DIAGNOSIS — N528 Other male erectile dysfunction: Secondary | ICD-10-CM

## 2024-06-26 ENCOUNTER — Ambulatory Visit: Admitting: Internal Medicine

## 2024-06-26 ENCOUNTER — Encounter: Payer: Self-pay | Admitting: Internal Medicine

## 2024-06-26 VITALS — BP 130/86 | HR 76 | Temp 97.1°F | Ht 77.0 in | Wt 367.6 lb

## 2024-06-26 DIAGNOSIS — Z6841 Body Mass Index (BMI) 40.0 and over, adult: Secondary | ICD-10-CM

## 2024-06-26 DIAGNOSIS — I10 Essential (primary) hypertension: Secondary | ICD-10-CM

## 2024-06-26 MED ORDER — CARVEDILOL 25 MG PO TABS: 25.0000 mg | ORAL_TABLET | Freq: Two times a day (BID) | ORAL | 0 refills | Status: DC

## 2024-06-26 MED ORDER — TIRZEPATIDE 12.5 MG/0.5ML ~~LOC~~ SOAJ: 12.5000 mg | SUBCUTANEOUS | 1 refills | Status: DC

## 2024-06-26 NOTE — Progress Notes (Signed)
 Established Patient Office Visit  Subjective:  Patient ID: Todd [name], male    DOB: 08/20/1981  Age: 43 y.o. MRN: [id number]  Chief Complaint  Patient presents with   Follow-up    2 month follow up    No new complaints, here for lab review and medication refills. Failed to lose weight but still exercising regularly.    No other concerns at this time.   Past Medical History:  Diagnosis Date   Arthritis     Past Surgical History:  Procedure Laterality Date   PATELLAR TENDON REPAIR Right 12/16/2021   Procedure: Right patellar tendon repair;  Surgeon: Tobie Priest, MD;  Location: [facility] ORS;  Service: Orthopedics;  Laterality: Right;    Social History   Socioeconomic History   Marital status: Significant Other    Spouse name: Not on file   Number of children: 3   Years of education: Not on file   Highest education level: Not on file  Occupational History   Not on file  Tobacco Use   Smoking status: Never   Smokeless tobacco: Not on file  Substance and Sexual Activity   Alcohol use: Not Currently   Drug use: Never   Sexual activity: Not on file  Other Topics Concern   Not on file  Social History Narrative   Not on file   Social Drivers of Health   Financial Resource Strain: Not on file  Food Insecurity: Not on file  Transportation Needs: Not on file  Physical Activity: Not on file  Stress: Not on file  Social Connections: Not on file  Intimate Partner Violence: Not on file    No family history on file.  No Known Allergies  Outpatient Medications Prior to Visit  Medication Sig   amLODipine -olmesartan  (AZOR ) 10-40 MG tablet Take 1 tablet by mouth daily.   carvedilol  (COREG ) 25 MG tablet TAKE 1 TABLET BY MOUTH TWICE  DAILY   tadalafil  (CIALIS ) 20 MG tablet TAKE 1 TABLET BY MOUTH ONCE DAILY AS NEEDED FOR ERECTILE DYSFUNCTION   tirzepatide  (ZEPBOUND ) 10 MG/0.5ML Pen Inject 10 mg into the skin once a week.   No facility-administered medications prior  to visit.    Review of Systems  Constitutional:  Negative for weight loss.  HENT: Negative.    Eyes: Negative.   Respiratory: Negative.    Cardiovascular: Negative.   Gastrointestinal: Negative.   Genitourinary: Negative.   Skin: Negative.   Neurological: Negative.  Negative for dizziness and headaches.  Endo/Heme/Allergies: Negative.        Objective:   BP 130/86   Pulse 76   Temp (!) 97.1 F (36.2 C) (Tympanic)   Ht 6' 5 (1.956 m)   Wt (!) 367 lb 9.6 oz (166.7 kg)   SpO2 97%   BMI 43.59 kg/m   Vitals:   06/26/24 0926  BP: 130/86  Pulse: 76  Temp: (!) 97.1 F (36.2 C)  Height: 6' 5 (1.956 m)  Weight: (!) 367 lb 9.6 oz (166.7 kg)  SpO2: 97%  TempSrc: Tympanic  BMI (Calculated): 43.58    Physical Exam Vitals reviewed.  Constitutional:      Appearance: Normal appearance. He is obese.  HENT:     Head: Normocephalic.     Left Ear: There is no impacted cerumen.     Nose: Nose normal.     Mouth/Throat:     Mouth: Mucous membranes are moist.     Pharynx: No posterior oropharyngeal erythema.  Eyes:  Extraocular Movements: Extraocular movements intact.     Pupils: Pupils are equal, round, and reactive to light.  Cardiovascular:     Rate and Rhythm: Regular rhythm.     Chest Wall: PMI is not displaced.     Pulses: Normal pulses.     Heart sounds: Normal heart sounds. No murmur heard. Pulmonary:     Effort: Pulmonary effort is normal.     Breath sounds: Normal air entry. No rhonchi or rales.  Abdominal:     General: Abdomen is flat. Bowel sounds are normal. There is no distension.     Palpations: Abdomen is soft. There is no hepatomegaly, splenomegaly or mass.     Tenderness: There is no abdominal tenderness.  Musculoskeletal:        General: Normal range of motion.     Cervical back: Normal range of motion and neck supple.     Right lower leg: No edema.     Left lower leg: No edema.  Skin:    General: Skin is warm and dry.  Neurological:      General: No focal deficit present.     Mental Status: He is alert and oriented to person, place, and time.     Cranial Nerves: No cranial nerve deficit.     Motor: No weakness.  Psychiatric:        Mood and Affect: Mood normal.        Behavior: Behavior normal.      No results found for any visits on 06/26/24.  No results found for this or any previous visit (from the past 2160 hours).    Assessment & Plan:  Henning was seen today for follow-up.  Morbid obesity with BMI of 40.0-44.9, adult (HCC) -     Tirzepatide ; Inject 12.5 mg into the skin once a week.  Dispense: 2 mL; Refill: 1  Primary hypertension -     Carvedilol ; Take 1 tablet (25 mg total) by mouth 2 (two) times daily.  Dispense: 180 tablet; Refill: 0    Problem List Items Addressed This Visit   None   No follow-ups on file.   Total time spent: 20 minutes  [name], MD  06/26/2024   This document may have been prepared by [facility] Voice Recognition software and as such may include unintentional dictation errors.

## 2024-07-22 ENCOUNTER — Other Ambulatory Visit: Payer: Self-pay | Admitting: Internal Medicine

## 2024-07-23 ENCOUNTER — Other Ambulatory Visit: Payer: Self-pay | Admitting: Internal Medicine

## 2024-07-23 MED ORDER — ZEPBOUND 12.5 MG/0.5ML ~~LOC~~ SOAJ: 12.5000 mg | SUBCUTANEOUS | 2 refills | Status: DC

## 2024-08-16 ENCOUNTER — Other Ambulatory Visit: Payer: Self-pay | Admitting: Internal Medicine

## 2024-08-16 DIAGNOSIS — N528 Other male erectile dysfunction: Secondary | ICD-10-CM

## 2024-08-28 ENCOUNTER — Encounter: Payer: Self-pay | Admitting: Internal Medicine

## 2024-08-28 ENCOUNTER — Ambulatory Visit: Admitting: Internal Medicine

## 2024-08-28 DIAGNOSIS — N528 Other male erectile dysfunction: Secondary | ICD-10-CM

## 2024-08-28 DIAGNOSIS — Z6841 Body Mass Index (BMI) 40.0 and over, adult: Secondary | ICD-10-CM | POA: Diagnosis not present

## 2024-08-28 DIAGNOSIS — I10 Essential (primary) hypertension: Secondary | ICD-10-CM | POA: Diagnosis not present

## 2024-08-28 MED ORDER — AMLODIPINE-OLMESARTAN 10-40 MG PO TABS: 1.0000 | ORAL_TABLET | Freq: Every day | ORAL | 1 refills | Status: AC

## 2024-08-28 MED ORDER — CARVEDILOL 25 MG PO TABS: 25.0000 mg | ORAL_TABLET | Freq: Two times a day (BID) | ORAL | 0 refills | Status: AC

## 2024-08-28 MED ORDER — ZEPBOUND 12.5 MG/0.5ML ~~LOC~~ SOAJ: 12.5000 mg | SUBCUTANEOUS | 2 refills | Status: DC

## 2024-08-28 MED ORDER — TADALAFIL 20 MG PO TABS: 20.0000 mg | ORAL_TABLET | Freq: Every day | ORAL | 0 refills | Status: DC | PRN

## 2024-08-28 NOTE — Progress Notes (Signed)
 Established Patient Office Visit  Subjective:  Patient ID: Todd [name], male    DOB: 1981-05-20  Age: 43 y.o. MRN: [id number]  Chief Complaint  Patient presents with   Follow-up    2 month follow up    No new complaints, here for weight management. Failed to lose weight as he missed two wks of medication due to insurance issues. Also admits to decreased exercise.     No other concerns at this time.   Past Medical History:  Diagnosis Date   Arthritis     Past Surgical History:  Procedure Laterality Date   PATELLAR TENDON REPAIR Right 12/16/2021   Procedure: Right patellar tendon repair;  Surgeon: Tobie Priest, MD;  Location: [facility] ORS;  Service: Orthopedics;  Laterality: Right;    Social History   Socioeconomic History   Marital status: Significant Other    Spouse name: Not on file   Number of children: 3   Years of education: Not on file   Highest education level: Not on file  Occupational History   Not on file  Tobacco Use   Smoking status: Never   Smokeless tobacco: Not on file  Substance and Sexual Activity   Alcohol use: Not Currently   Drug use: Never   Sexual activity: Not on file  Other Topics Concern   Not on file  Social History Narrative   Not on file   Social Drivers of Health   Financial Resource Strain: Not on file  Food Insecurity: Not on file  Transportation Needs: Not on file  Physical Activity: Not on file  Stress: Not on file  Social Connections: Not on file  Intimate Partner Violence: Not on file    No family history on file.  No Known Allergies  Outpatient Medications Prior to Visit  Medication Sig   [DISCONTINUED] amLODipine -olmesartan  (AZOR ) 10-40 MG tablet Take 1 tablet by mouth daily.   [DISCONTINUED] carvedilol  (COREG ) 25 MG tablet Take 1 tablet (25 mg total) by mouth 2 (two) times daily.   [DISCONTINUED] tadalafil  (CIALIS ) 20 MG tablet TAKE 1 TABLET BY MOUTH ONCE DAILY AS NEEDED FOR ERECTILE DYSFUNCTION    [DISCONTINUED] tirzepatide  (ZEPBOUND ) 12.5 MG/0.5ML Pen Inject 12.5 mg into the skin once a week.   No facility-administered medications prior to visit.    Review of Systems  Constitutional:  Negative for weight loss.  HENT: Negative.    Eyes: Negative.   Respiratory: Negative.    Cardiovascular: Negative.   Gastrointestinal: Negative.   Genitourinary: Negative.   Skin: Negative.   Neurological: Negative.  Negative for dizziness and headaches.  Endo/Heme/Allergies: Negative.        Objective:   BP 132/82   Pulse 66   Temp (!) 97.5 F (36.4 C) (Tympanic)   Ht 6' 5 (1.956 m)   Wt (!) 370 lb 3.2 oz (167.9 kg)   SpO2 95%   BMI 43.90 kg/m   Vitals:   08/28/24 1059  BP: 132/82  Pulse: 66  Temp: (!) 97.5 F (36.4 C)  Height: 6' 5 (1.956 m)  Weight: (!) 370 lb 3.2 oz (167.9 kg)  SpO2: 95%  TempSrc: Tympanic  BMI (Calculated): 43.89    Physical Exam Vitals reviewed.  Constitutional:      Appearance: Normal appearance. He is obese.  HENT:     Head: Normocephalic.     Left Ear: There is no impacted cerumen.     Nose: Nose normal.     Mouth/Throat:     Mouth: Mucous  membranes are moist.     Pharynx: No posterior oropharyngeal erythema.  Eyes:     Extraocular Movements: Extraocular movements intact.     Pupils: Pupils are equal, round, and reactive to light.  Cardiovascular:     Rate and Rhythm: Regular rhythm.     Chest Wall: PMI is not displaced.     Pulses: Normal pulses.     Heart sounds: Normal heart sounds. No murmur heard. Pulmonary:     Effort: Pulmonary effort is normal.     Breath sounds: Normal air entry. No rhonchi or rales.  Abdominal:     General: Abdomen is flat. Bowel sounds are normal. There is no distension.     Palpations: Abdomen is soft. There is no hepatomegaly, splenomegaly or mass.     Tenderness: There is no abdominal tenderness.  Musculoskeletal:        General: Normal range of motion.     Cervical back: Normal range of motion  and neck supple.     Right lower leg: No edema.     Left lower leg: No edema.  Skin:    General: Skin is warm and dry.  Neurological:     General: No focal deficit present.     Mental Status: He is alert and oriented to person, place, and time.     Cranial Nerves: No cranial nerve deficit.     Motor: No weakness.  Psychiatric:        Mood and Affect: Mood normal.        Behavior: Behavior normal.      No results found for any visits on 08/28/24.  No results found for this or any previous visit (from the past 2160 hours). Maintain current dose of Zepbound  and increase exercise.   Assessment & Plan:  Sotero was seen today for follow-up.  Other male erectile dysfunction -     Tadalafil ; Take 1 tablet (20 mg total) by mouth daily as needed for erectile dysfunction.  Dispense: 20 tablet; Refill: 0  Morbid obesity with BMI of 40.0-44.9, adult (HCC) -     Zepbound ; Inject 12.5 mg into the skin once a week.  Dispense: 2 mL; Refill: 2  Primary hypertension -     Carvedilol ; Take 1 tablet (25 mg total) by mouth 2 (two) times daily.  Dispense: 180 tablet; Refill: 0  Severe hypertension -     amLODIPine -Olmesartan ; Take 1 tablet by mouth daily.  Dispense: 90 tablet; Refill: 1    Problem List Items Addressed This Visit       Cardiovascular and Mediastinum   Primary hypertension   Relevant Medications   tadalafil  (CIALIS ) 20 MG tablet   carvedilol  (COREG ) 25 MG tablet   amLODipine -olmesartan  (AZOR ) 10-40 MG tablet   Severe hypertension   Relevant Medications   tadalafil  (CIALIS ) 20 MG tablet   carvedilol  (COREG ) 25 MG tablet   amLODipine -olmesartan  (AZOR ) 10-40 MG tablet     Other   Morbid obesity with BMI of 40.0-44.9, adult (HCC)   Relevant Medications   tirzepatide  (ZEPBOUND ) 12.5 MG/0.5ML Pen   Other male erectile dysfunction   Relevant Medications   tadalafil  (CIALIS ) 20 MG tablet    No follow-ups on file.   Total time spent: 20 minutes. This time includes  review of previous notes and results and patient face to face interaction during today'Nakira Litzau visit.    [name], MD  08/28/2024   This document may have been prepared by [facility] Voice Recognition software and as  such may include unintentional dictation errors.

## 2024-10-07 ENCOUNTER — Other Ambulatory Visit: Payer: Self-pay

## 2024-10-07 MED ORDER — ZEPBOUND 12.5 MG/0.5ML ~~LOC~~ SOAJ: 12.5000 mg | SUBCUTANEOUS | 2 refills | Status: AC

## 2024-10-30 ENCOUNTER — Ambulatory Visit: Admitting: Internal Medicine

## 2024-11-01 ENCOUNTER — Other Ambulatory Visit: Payer: Self-pay | Admitting: Internal Medicine

## 2024-11-01 DIAGNOSIS — N528 Other male erectile dysfunction: Secondary | ICD-10-CM
